# Patient Record
Sex: Female | Born: 1935 | Race: White | Hispanic: No
Health system: Southern US, Community
[De-identification: ages and names within clinical notes are randomized; demographics above are authoritative.]

---

## 1999-02-10 ENCOUNTER — Ambulatory Visit (HOSPITAL_COMMUNITY): Admission: RE | Admit: 1999-02-10 | Discharge: 1999-02-10 | Payer: Self-pay | Admitting: Family Medicine

## 1999-05-28 ENCOUNTER — Inpatient Hospital Stay (HOSPITAL_COMMUNITY): Admission: EM | Admit: 1999-05-28 | Discharge: 1999-06-02 | Payer: Self-pay | Admitting: *Deleted

## 1999-05-28 ENCOUNTER — Encounter: Payer: Self-pay | Admitting: Internal Medicine

## 1999-05-28 ENCOUNTER — Encounter (INDEPENDENT_AMBULATORY_CARE_PROVIDER_SITE_OTHER): Payer: Self-pay | Admitting: Specialist

## 1999-11-25 ENCOUNTER — Emergency Department (HOSPITAL_COMMUNITY): Admission: EM | Admit: 1999-11-25 | Discharge: 1999-11-25 | Payer: Self-pay | Admitting: Emergency Medicine

## 1999-11-25 ENCOUNTER — Encounter: Payer: Self-pay | Admitting: Emergency Medicine

## 1999-11-29 ENCOUNTER — Encounter: Payer: Self-pay | Admitting: Specialist

## 1999-11-29 ENCOUNTER — Encounter: Admission: RE | Admit: 1999-11-29 | Discharge: 1999-11-29 | Payer: Self-pay | Admitting: Specialist

## 2000-08-22 ENCOUNTER — Emergency Department (HOSPITAL_COMMUNITY): Admission: EM | Admit: 2000-08-22 | Discharge: 2000-08-22 | Payer: Self-pay | Admitting: Emergency Medicine

## 2001-02-10 ENCOUNTER — Emergency Department (HOSPITAL_COMMUNITY): Admission: EM | Admit: 2001-02-10 | Discharge: 2001-02-10 | Payer: Self-pay | Admitting: Emergency Medicine

## 2002-01-22 ENCOUNTER — Encounter: Payer: Self-pay | Admitting: Emergency Medicine

## 2002-01-23 ENCOUNTER — Observation Stay (HOSPITAL_COMMUNITY): Admission: EM | Admit: 2002-01-23 | Discharge: 2002-01-23 | Payer: Self-pay | Admitting: Emergency Medicine

## 2002-01-23 ENCOUNTER — Encounter: Payer: Self-pay | Admitting: Internal Medicine

## 2002-03-08 ENCOUNTER — Encounter: Payer: Self-pay | Admitting: Gastroenterology

## 2002-03-08 ENCOUNTER — Encounter: Admission: RE | Admit: 2002-03-08 | Discharge: 2002-03-08 | Payer: Self-pay | Admitting: Gastroenterology

## 2002-03-29 ENCOUNTER — Encounter: Payer: Self-pay | Admitting: Gastroenterology

## 2002-03-29 ENCOUNTER — Encounter: Admission: RE | Admit: 2002-03-29 | Discharge: 2002-03-29 | Payer: Self-pay | Admitting: Gastroenterology

## 2002-05-31 ENCOUNTER — Encounter: Payer: Self-pay | Admitting: Emergency Medicine

## 2002-05-31 ENCOUNTER — Inpatient Hospital Stay (HOSPITAL_COMMUNITY): Admission: EM | Admit: 2002-05-31 | Discharge: 2002-06-02 | Payer: Self-pay | Admitting: Emergency Medicine

## 2002-07-09 ENCOUNTER — Encounter: Payer: Self-pay | Admitting: Emergency Medicine

## 2002-07-09 ENCOUNTER — Inpatient Hospital Stay (HOSPITAL_COMMUNITY): Admission: EM | Admit: 2002-07-09 | Discharge: 2002-07-10 | Payer: Self-pay | Admitting: Emergency Medicine

## 2002-07-10 ENCOUNTER — Encounter: Payer: Self-pay | Admitting: Neurology

## 2002-07-11 ENCOUNTER — Emergency Department (HOSPITAL_COMMUNITY): Admission: EM | Admit: 2002-07-11 | Discharge: 2002-07-11 | Payer: Self-pay | Admitting: Emergency Medicine

## 2002-07-11 ENCOUNTER — Encounter: Payer: Self-pay | Admitting: Emergency Medicine

## 2002-10-06 ENCOUNTER — Encounter: Payer: Self-pay | Admitting: *Deleted

## 2002-10-06 ENCOUNTER — Emergency Department (HOSPITAL_COMMUNITY): Admission: EM | Admit: 2002-10-06 | Discharge: 2002-10-06 | Payer: Self-pay | Admitting: Emergency Medicine

## 2002-11-26 ENCOUNTER — Ambulatory Visit (HOSPITAL_COMMUNITY): Admission: RE | Admit: 2002-11-26 | Discharge: 2002-11-26 | Payer: Self-pay | Admitting: Neurology

## 2003-01-03 ENCOUNTER — Encounter: Payer: Self-pay | Admitting: Specialist

## 2003-01-03 ENCOUNTER — Encounter: Admission: RE | Admit: 2003-01-03 | Discharge: 2003-01-03 | Payer: Self-pay | Admitting: Specialist

## 2003-01-05 ENCOUNTER — Emergency Department (HOSPITAL_COMMUNITY): Admission: AD | Admit: 2003-01-05 | Discharge: 2003-01-05 | Payer: Self-pay | Admitting: Emergency Medicine

## 2003-01-05 ENCOUNTER — Encounter: Payer: Self-pay | Admitting: Emergency Medicine

## 2003-01-30 ENCOUNTER — Ambulatory Visit (HOSPITAL_COMMUNITY): Admission: RE | Admit: 2003-01-30 | Discharge: 2003-01-30 | Payer: Self-pay | Admitting: Family Medicine

## 2003-01-30 ENCOUNTER — Encounter: Payer: Self-pay | Admitting: Family Medicine

## 2003-02-14 ENCOUNTER — Encounter: Payer: Self-pay | Admitting: Orthopaedic Surgery

## 2003-02-19 ENCOUNTER — Encounter: Payer: Self-pay | Admitting: Orthopaedic Surgery

## 2003-02-19 ENCOUNTER — Inpatient Hospital Stay (HOSPITAL_COMMUNITY): Admission: RE | Admit: 2003-02-19 | Discharge: 2003-02-25 | Payer: Self-pay | Admitting: Orthopaedic Surgery

## 2003-02-24 ENCOUNTER — Encounter: Payer: Self-pay | Admitting: Orthopaedic Surgery

## 2003-02-25 ENCOUNTER — Encounter: Payer: Self-pay | Admitting: Orthopaedic Surgery

## 2003-03-10 ENCOUNTER — Encounter: Admission: RE | Admit: 2003-03-10 | Discharge: 2003-03-10 | Payer: Self-pay | Admitting: *Deleted

## 2003-03-12 ENCOUNTER — Encounter: Admission: RE | Admit: 2003-03-12 | Discharge: 2003-03-12 | Payer: Self-pay | Admitting: *Deleted

## 2003-06-25 ENCOUNTER — Encounter: Admission: RE | Admit: 2003-06-25 | Discharge: 2003-06-25 | Payer: Self-pay | Admitting: Family Medicine

## 2003-08-24 ENCOUNTER — Emergency Department (HOSPITAL_COMMUNITY): Admission: EM | Admit: 2003-08-24 | Discharge: 2003-08-24 | Payer: Self-pay | Admitting: Emergency Medicine

## 2003-08-30 ENCOUNTER — Emergency Department (HOSPITAL_COMMUNITY): Admission: EM | Admit: 2003-08-30 | Discharge: 2003-08-30 | Payer: Self-pay | Admitting: Family Medicine

## 2003-10-14 ENCOUNTER — Emergency Department (HOSPITAL_COMMUNITY): Admission: EM | Admit: 2003-10-14 | Discharge: 2003-10-14 | Payer: Self-pay | Admitting: Emergency Medicine

## 2004-01-23 ENCOUNTER — Emergency Department (HOSPITAL_COMMUNITY): Admission: EM | Admit: 2004-01-23 | Discharge: 2004-01-23 | Payer: Self-pay | Admitting: Emergency Medicine

## 2004-09-02 ENCOUNTER — Encounter: Admission: RE | Admit: 2004-09-02 | Discharge: 2004-09-02 | Payer: Self-pay | Admitting: Orthopaedic Surgery

## 2005-01-18 ENCOUNTER — Encounter: Admission: RE | Admit: 2005-01-18 | Discharge: 2005-01-18 | Payer: Self-pay | Admitting: Family Medicine

## 2005-02-20 ENCOUNTER — Emergency Department (HOSPITAL_COMMUNITY): Admission: EM | Admit: 2005-02-20 | Discharge: 2005-02-20 | Payer: Self-pay | Admitting: Family Medicine

## 2005-12-21 ENCOUNTER — Emergency Department (HOSPITAL_COMMUNITY): Admission: EM | Admit: 2005-12-21 | Discharge: 2005-12-21 | Payer: Self-pay | Admitting: Family Medicine

## 2006-01-09 ENCOUNTER — Emergency Department (HOSPITAL_COMMUNITY): Admission: EM | Admit: 2006-01-09 | Discharge: 2006-01-09 | Payer: Self-pay | Admitting: Emergency Medicine

## 2006-01-23 ENCOUNTER — Emergency Department (HOSPITAL_COMMUNITY): Admission: EM | Admit: 2006-01-23 | Discharge: 2006-01-23 | Payer: Self-pay | Admitting: Emergency Medicine

## 2006-01-31 ENCOUNTER — Ambulatory Visit (HOSPITAL_COMMUNITY): Admission: AD | Admit: 2006-01-31 | Discharge: 2006-02-01 | Payer: Self-pay | Admitting: Vascular Surgery

## 2006-02-02 ENCOUNTER — Emergency Department (HOSPITAL_COMMUNITY): Admission: EM | Admit: 2006-02-02 | Discharge: 2006-02-02 | Payer: Self-pay | Admitting: Emergency Medicine

## 2007-03-07 ENCOUNTER — Emergency Department (HOSPITAL_COMMUNITY): Admission: EM | Admit: 2007-03-07 | Discharge: 2007-03-07 | Payer: Self-pay | Admitting: Family Medicine

## 2007-04-03 ENCOUNTER — Emergency Department (HOSPITAL_COMMUNITY): Admission: EM | Admit: 2007-04-03 | Discharge: 2007-04-03 | Payer: Self-pay | Admitting: Emergency Medicine

## 2007-04-10 ENCOUNTER — Emergency Department (HOSPITAL_COMMUNITY): Admission: EM | Admit: 2007-04-10 | Discharge: 2007-04-10 | Payer: Self-pay | Admitting: Emergency Medicine

## 2007-04-24 ENCOUNTER — Ambulatory Visit: Payer: Self-pay | Admitting: Vascular Surgery

## 2007-09-13 ENCOUNTER — Emergency Department (HOSPITAL_COMMUNITY): Admission: EM | Admit: 2007-09-13 | Discharge: 2007-09-13 | Payer: Self-pay | Admitting: Emergency Medicine

## 2007-10-30 ENCOUNTER — Ambulatory Visit: Payer: Self-pay | Admitting: Vascular Surgery

## 2007-10-31 ENCOUNTER — Ambulatory Visit: Payer: Self-pay | Admitting: Vascular Surgery

## 2007-11-15 ENCOUNTER — Inpatient Hospital Stay (HOSPITAL_COMMUNITY): Admission: RE | Admit: 2007-11-15 | Discharge: 2007-11-16 | Payer: Self-pay | Admitting: Vascular Surgery

## 2007-11-15 ENCOUNTER — Ambulatory Visit: Payer: Self-pay | Admitting: Vascular Surgery

## 2007-11-15 ENCOUNTER — Encounter: Payer: Self-pay | Admitting: Vascular Surgery

## 2007-11-20 ENCOUNTER — Ambulatory Visit: Payer: Self-pay | Admitting: Vascular Surgery

## 2007-12-05 ENCOUNTER — Ambulatory Visit: Payer: Self-pay | Admitting: Vascular Surgery

## 2008-07-23 ENCOUNTER — Emergency Department (HOSPITAL_COMMUNITY): Admission: EM | Admit: 2008-07-23 | Discharge: 2008-07-23 | Payer: Self-pay | Admitting: Emergency Medicine

## 2008-11-20 ENCOUNTER — Emergency Department (HOSPITAL_COMMUNITY): Admission: EM | Admit: 2008-11-20 | Discharge: 2008-11-20 | Payer: Self-pay | Admitting: Emergency Medicine

## 2009-01-14 ENCOUNTER — Ambulatory Visit: Payer: Self-pay | Admitting: Pulmonary Disease

## 2009-01-14 ENCOUNTER — Inpatient Hospital Stay (HOSPITAL_COMMUNITY): Admission: EM | Admit: 2009-01-14 | Discharge: 2009-02-12 | Payer: Self-pay | Admitting: Emergency Medicine

## 2009-01-15 ENCOUNTER — Encounter (INDEPENDENT_AMBULATORY_CARE_PROVIDER_SITE_OTHER): Payer: Self-pay | Admitting: Cardiovascular Disease

## 2009-01-15 ENCOUNTER — Ambulatory Visit: Payer: Self-pay | Admitting: Cardiothoracic Surgery

## 2009-01-19 ENCOUNTER — Encounter: Payer: Self-pay | Admitting: Internal Medicine

## 2009-01-22 ENCOUNTER — Encounter (INDEPENDENT_AMBULATORY_CARE_PROVIDER_SITE_OTHER): Payer: Self-pay | Admitting: Interventional Radiology

## 2009-02-06 ENCOUNTER — Encounter (INDEPENDENT_AMBULATORY_CARE_PROVIDER_SITE_OTHER): Payer: Self-pay | Admitting: Gastroenterology

## 2009-02-16 ENCOUNTER — Emergency Department (HOSPITAL_COMMUNITY): Admission: EM | Admit: 2009-02-16 | Discharge: 2009-02-16 | Payer: Self-pay | Admitting: Emergency Medicine

## 2009-02-24 ENCOUNTER — Telehealth: Payer: Self-pay | Admitting: Internal Medicine

## 2009-02-24 ENCOUNTER — Ambulatory Visit (HOSPITAL_COMMUNITY): Admission: RE | Admit: 2009-02-24 | Discharge: 2009-02-24 | Payer: Self-pay | Admitting: Internal Medicine

## 2009-03-05 ENCOUNTER — Ambulatory Visit: Payer: Self-pay | Admitting: Vascular Surgery

## 2009-08-06 ENCOUNTER — Inpatient Hospital Stay (HOSPITAL_COMMUNITY): Admission: EM | Admit: 2009-08-06 | Discharge: 2009-08-12 | Payer: Self-pay | Admitting: Emergency Medicine

## 2009-08-06 ENCOUNTER — Ambulatory Visit: Payer: Self-pay | Admitting: Internal Medicine

## 2009-08-06 ENCOUNTER — Ambulatory Visit: Payer: Self-pay | Admitting: Pulmonary Disease

## 2009-08-06 ENCOUNTER — Encounter: Payer: Self-pay | Admitting: Internal Medicine

## 2009-08-07 ENCOUNTER — Encounter: Payer: Self-pay | Admitting: Internal Medicine

## 2009-08-08 ENCOUNTER — Ambulatory Visit: Payer: Self-pay | Admitting: Vascular Surgery

## 2009-08-11 ENCOUNTER — Ambulatory Visit: Payer: Self-pay | Admitting: Physical Medicine & Rehabilitation

## 2009-08-12 ENCOUNTER — Inpatient Hospital Stay (HOSPITAL_COMMUNITY)
Admission: RE | Admit: 2009-08-12 | Discharge: 2009-08-28 | Payer: Self-pay | Admitting: Physical Medicine & Rehabilitation

## 2009-08-14 ENCOUNTER — Encounter: Payer: Self-pay | Admitting: Internal Medicine

## 2009-08-17 ENCOUNTER — Encounter: Payer: Self-pay | Admitting: Physical Medicine & Rehabilitation

## 2009-08-20 ENCOUNTER — Ambulatory Visit: Payer: Self-pay | Admitting: Oncology

## 2009-08-22 ENCOUNTER — Ambulatory Visit: Payer: Self-pay | Admitting: Physical Medicine & Rehabilitation

## 2009-08-28 ENCOUNTER — Ambulatory Visit: Payer: Self-pay | Admitting: Oncology

## 2009-08-28 ENCOUNTER — Inpatient Hospital Stay (HOSPITAL_COMMUNITY): Admission: EM | Admit: 2009-08-28 | Discharge: 2009-09-08 | Payer: Self-pay | Admitting: Internal Medicine

## 2009-08-29 ENCOUNTER — Ambulatory Visit: Payer: Self-pay | Admitting: Oncology

## 2009-08-30 ENCOUNTER — Ambulatory Visit: Payer: Self-pay | Admitting: Oncology

## 2009-09-01 ENCOUNTER — Other Ambulatory Visit: Payer: Self-pay | Admitting: Oncology

## 2009-09-03 ENCOUNTER — Other Ambulatory Visit: Payer: Self-pay | Admitting: Oncology

## 2009-09-05 ENCOUNTER — Ambulatory Visit: Payer: Self-pay | Admitting: Oncology

## 2009-09-09 ENCOUNTER — Ambulatory Visit: Admission: RE | Admit: 2009-09-09 | Discharge: 2009-09-18 | Payer: Self-pay | Admitting: Radiation Oncology

## 2009-09-11 ENCOUNTER — Encounter: Admission: RE | Admit: 2009-09-11 | Discharge: 2009-12-10 | Payer: Self-pay | Admitting: Oncology

## 2009-09-18 LAB — BASIC METABOLIC PANEL
BUN: 29 mg/dL — ABNORMAL HIGH (ref 6–23)
Chloride: 104 mEq/L (ref 96–112)
Creatinine, Ser: 0.86 mg/dL (ref 0.40–1.20)
Glucose, Bld: 108 mg/dL — ABNORMAL HIGH (ref 70–99)
Potassium: 4.5 mEq/L (ref 3.5–5.3)

## 2009-09-18 LAB — PHENYTOIN LEVEL, TOTAL: Phenytoin Lvl: 2.7 ug/mL — ABNORMAL LOW (ref 10.0–20.0)

## 2009-09-22 ENCOUNTER — Inpatient Hospital Stay (HOSPITAL_COMMUNITY): Admission: EM | Admit: 2009-09-22 | Discharge: 2009-09-26 | Payer: Self-pay | Admitting: Emergency Medicine

## 2009-09-28 ENCOUNTER — Inpatient Hospital Stay (HOSPITAL_COMMUNITY): Admission: EM | Admit: 2009-09-28 | Discharge: 2009-09-30 | Payer: Self-pay | Admitting: Neurology

## 2009-09-29 ENCOUNTER — Ambulatory Visit: Payer: Self-pay | Admitting: Oncology

## 2009-10-05 ENCOUNTER — Ambulatory Visit: Admission: RE | Admit: 2009-10-05 | Discharge: 2009-10-05 | Payer: Self-pay | Admitting: Radiation Oncology

## 2009-10-05 ENCOUNTER — Inpatient Hospital Stay (HOSPITAL_COMMUNITY)
Admission: AD | Admit: 2009-10-05 | Discharge: 2009-10-13 | Disposition: A | Discharge status: Hospice | Payer: Self-pay | Source: Home / Self Care | Admitting: Oncology

## 2009-10-05 ENCOUNTER — Ambulatory Visit: Payer: Self-pay | Admitting: Oncology

## 2009-11-08 DEATH — deceased

## 2010-02-15 IMAGING — CR DG CHEST 2V
2 series · 2 of 2 positions shown · non-contrast
Comparison: 01/30/2009

CLINICAL DATA: Chest pain.  Myocardial infarction.

CHEST - 2 VIEW

[w chest pa]
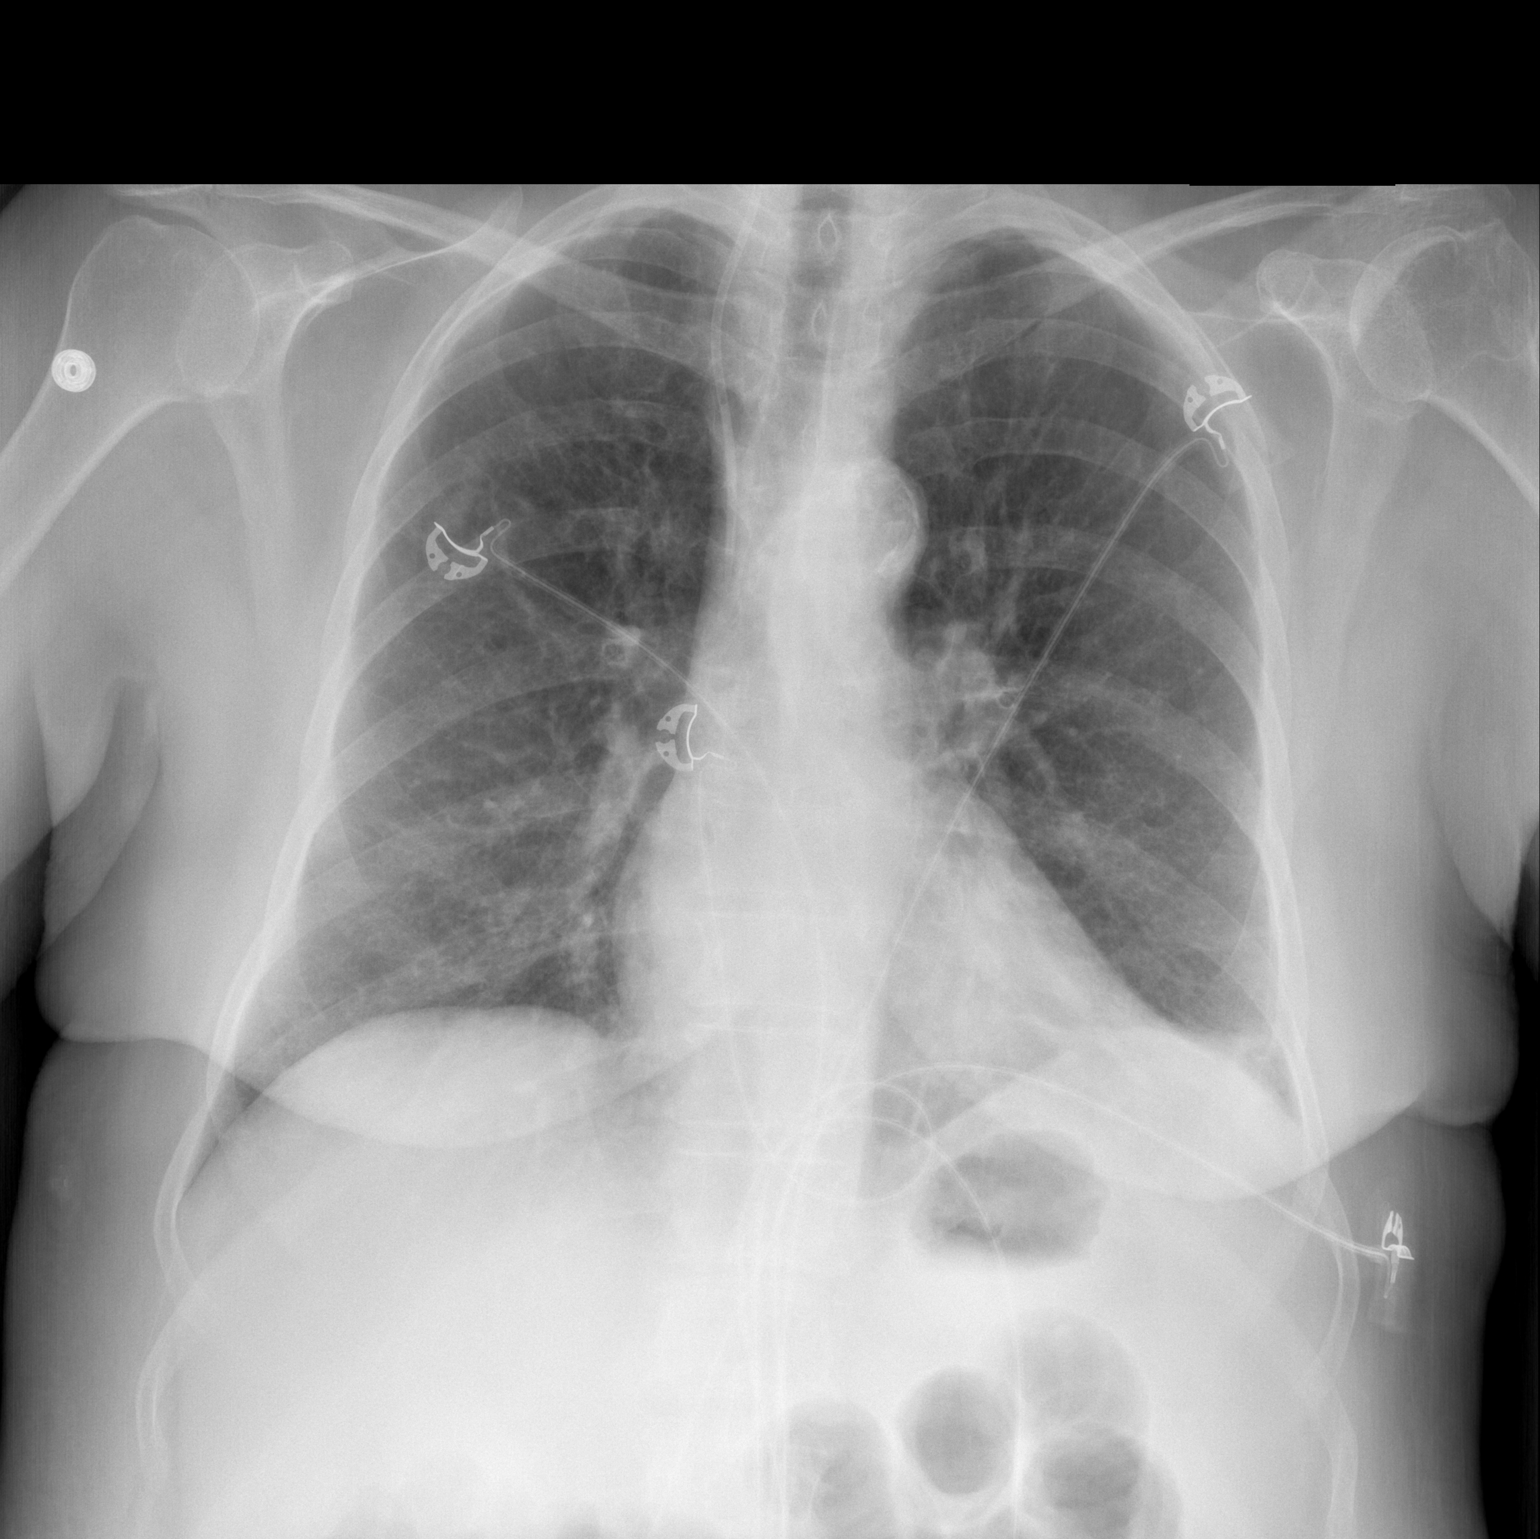

[w chest lat]
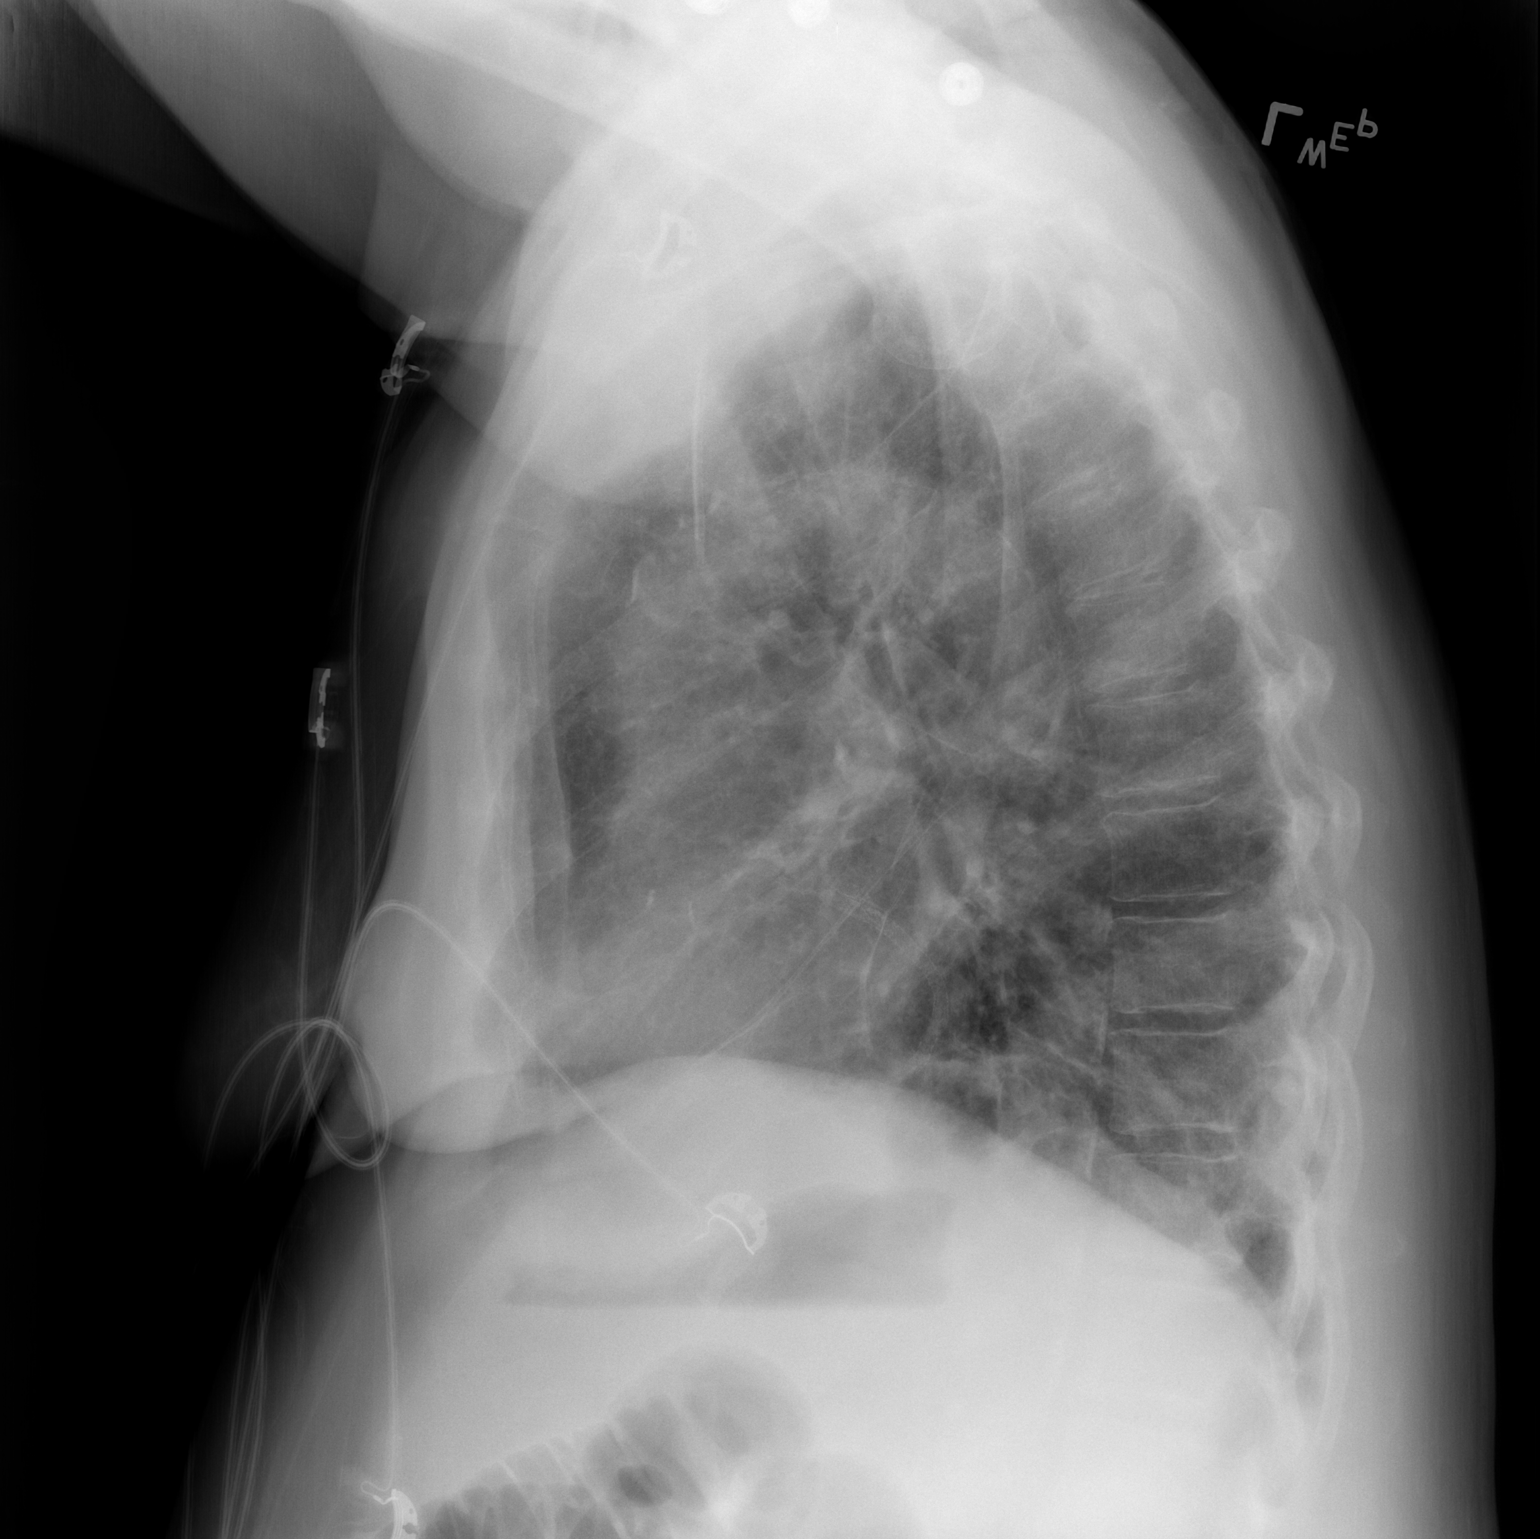

[2 of 2 positions shown; findings below may reference images not displayed]

FINDINGS: There is a right IJ catheter with tip in the SVC.

The heart size is normal.

Mild interstitial edema is noted.

There has been improved aeration to both lung bases.
IMPRESSION: 1.  Mild residual interstitial edema.
2.  Improving basilar aeration.

## 2010-08-01 ENCOUNTER — Encounter: Payer: Self-pay | Admitting: Neurology

## 2010-08-12 NOTE — Miscellaneous (Signed)
Summary: hospital admission  INTERNAL MEDICINE ADMISSION HISTORY AND PHYSICAL  PCP: Dr. Toni Arthurs [phone]. Fax: [phone])  Cardiologist: [facility]   ATTENDING 1st contact: Dr. Scot Dock: [phone] 2nd contact : Dr. Sherryll Burger: [phone] Nights and weekends: 1st: [phone]. 2nd: [phone]  CC:  HPI: 77 y/o woman with PMH of CAD s/p stenting in July 2010, DVP with carotid endarterectomy, cerebral aneurysm repaired in 2003 who comes to ED with cc of rt sided weakness since last 5 days. The weakness started all of a sudden. Patient noticed this first time as she got out of the bed in the morning. She felt weakness just on the right arm and leg. No change on the left side, no speech/swallowing difficulty, no vision change, no headaches, chest pain or other precipitating event before the onset of events. The weakness had been waxing and waning butsince yesterday it has been worse. She also has some numbness in her right leg . She fell down due to weakness on DOA and hit her head and chest.   Patient is not able to bear her weight on the rt side now. She saw her PCP 1 day after onset of symptoms, no worker for stroke was done at that time.    The family is at the bedside, who reports that they have not noticed any change in the patient's speech or other motro sensory function except right sided weakness.   She also complaints of some left sided chest pain that started after the fall. Intermittent. radiating to arm. Worse by movement. Not releived by anything.  ALLERGIES: Cipro, Supha, Rocephin, Lipitor - rash  PAST MEDICAL HISTORY:     MEDICATIONS: . Aspirin 325 mg one daily. . Nitroglycerin 1/150 under tongue as needed for chest pain, one     every 5 minutes up to three over 15 minutes. . Protonix 40 mg one twice a day. . Cozaar 50 mg one daily. Marland Kitchen Plavix 75 mg daily. . Tylenol/ibuprofen 650 mg two every 4 hours as needed for discomfort. . Ensure chocolate twice a day. Marland Kitchen Paxil: 10mg  by mouth  daily  SOCIAL HISTORY: - smoking: quit 2 days ago. has been smoking 1pack/day before that for >40 years.  - no alcohol/drugs - lives with daughter and her family. Has somebody in the family who takes care of her 24 hrs/day   FAMILY HISTORY significant for stroke, HTN, CAD in her mother.   VITALS: T: 97.7   P: 78  BP:187/86  R:18  O2SAT:  93 %    ON: RA  PHYSICAL EXAM: Gen: awake, lying in bed, NAD HEENT: PERRLA/EOMI, no pallor, jaundice CVS:  s1 normal,  systolic murmur on left sternal border Respi: coarse breath sounds b/l with occasional wheezes Abd: s/NT/ND, healed incision from previous abdminal surgery.  Neuro: Cranial: 2-12 intact Sensation: decreased in the rt lef and rt foot. normal elsewhere Motor: 3-4/5 strength on right arm and leg. Normal strength on the left side Reflexes: up going babinski refelxes in both sides, Balance: no cerebellar signs. normal coordination,  Gait: no able to asess as patient is unable to bear weight on thert side Back: normal, healed surgical scar.   Extremity: pulsations b/l, no edema  LABS:   WBC                                      7.3  4.0-10.5         K/uL  RBC                                      4.24              3.87-5.11        MIL/uL  Hemoglobin (HGB)                         13.5              12.0-15.0        g/dL  Hematocrit (HCT)                         38.6              36.0-46.0        %  MCV                                      91.0              78.0-100.0       fL  MCHC                                     35.0              30.0-36.0        g/dL  RDW                                      12.8              11.5-15.5        %  Platelet Count (PLT)                     246               150-400          K/uL  Neutrophils, %                           75                43-77            %  Lymphocytes, %                           18                12-46            %  Monocytes, %                             4                  3-12             %  Eosinophils, %  2                 0-5              %  Basophils, %                             1                 0-1              %  Neutrophils, Absolute                    5.4               1.7-7.7          K/uL  Lymphocytes, Absolute                    1.3               0.7-4.0          K/uL  Monocytes, Absolute                      0.3               0.1-1.0          K/uL  Eosinophils, Absolute                    0.2               0.0-0.7          K/uL  Basophils, Absolute                      0.1               0.0-0.1          K/uL   Sodium (NA)                              135               135-145          mEq/L  Potassium (K)                            3.6               3.5-5.1          mEq/L  Chloride                                 102               96-112           mEq/L  CO2                                      24                19-32            mEq/L  Glucose  100        h      70-99            mg/dL  BUN                                      18                6-23             mg/dL  Creatinine                               0.99              0.4-1.2          mg/dL  Calcium                                  8.9               8.4-10.5         mg/dL  CE:  Troponin I                               0.01              0.00-0.06        ng/mL Creatine Kinase, Total                   110               7-177            U/L CK, MB                                   6.0        h      0.3-4.0          ng/mL Relative Index                           5.5        h      0.0-2.5  CT head:  1.  Stable postoperative changes as above. 2.  Acute non-hemorrhagic infarct in the left hemisphere as above.  EKG ASSESSMENT AND PLAN:  1. Ischemic stroke:  - She is having a stroke and is clearly outside the therapeutic window.  - She is not a candidate for TPA as she has a h/o brain aneurysm - She has been on aspirin and  plavix at home- so she has failed this treatment  Plan: - Risk stratification- will get lipid profile, HbA1C, TSH - Sourse: will get 2D echo and carotid doppler. Possible- hypercoaguable state from malignancy.  - aspirin+dipyridimalo( aggrenox) for aniplatelet activity but not clear benifit  - she discontinued taking statin ( crestor) - she says due to financial reasons.  -PT/OT-inpatient rehab if patient agrees post discharge  2. CAD:  - will continue with her home medications  - no futher work up at this time as no complaints suggestive of ACS - EKG no acute change  3. Lung mass - needs a tissue  biopsy at some point as outpatient. An appointment was scheduled with Dr. Marchelle Gearing but patient never made it to the appointment. will get a CT scan to see if the mass has chenged in any way and reschedule an appointment with Dr. Marchelle Gearing  4. Depression- continue with paxil.   5. VTE PROPH: SCDs for now. my need anticoagulation as she may be hypercoagulable due to lung malignancy. But the risk of bleeding from her brain anurysm is high.

## 2010-08-12 NOTE — Miscellaneous (Signed)
Summary: pt no show  ---- Converted from flag ---- ---- 08/08/2009 2:23 PM, Kalman Shan MD wrote: Candise Bowens, Can you investigate what happend to patient and why she no showed. And when she no showed if letter was sent or not. I met with resident Jarvis Newcomer an hour ago. Patient now inpatient and mass is now 4.5cm and much larger. Apparently she told the resident no one told her about the mass which I know is not correct. Thanks, Shonette Rhames ------------------------------  We do not routinely send out letters for first no show. I received a flag and scheduled the pt for the appt. She received a reminder call 2 days before appt.

## 2010-09-06 IMAGING — CT CT HEAD W/ CM
1 of 2 series · 13 of 30 positions shown, 17 images · IV contrast (agent unspecified)
Comparison: 08/28/2009 and earlier.

CLINICAL DATA: 78-year-old female with lung cancer and cerebral
edema, suspicion of metastatic disease.

CT HEAD WITH CONTRAST
TECHNIQUE: Contiguous axial images were obtained from the base of
the skull through the vertex with intravenous contrast
Contrast: 100 ml [id number].

[Series 2: brain · axial · 0.55mm/px · z∈[+142,+274]mm · 13 of 28 slices shown, 17 images]
[im 2/28  brain]
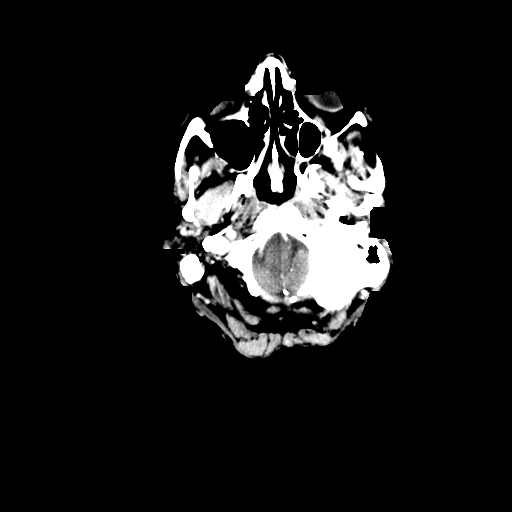
[im 2/28  bone]
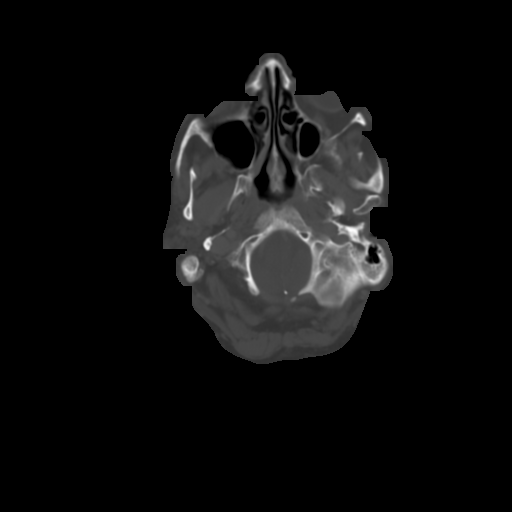
[im 4/28  brain]
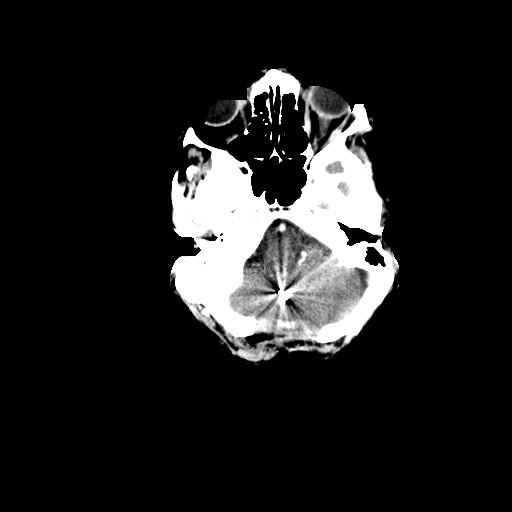
[im 6/28  brain]
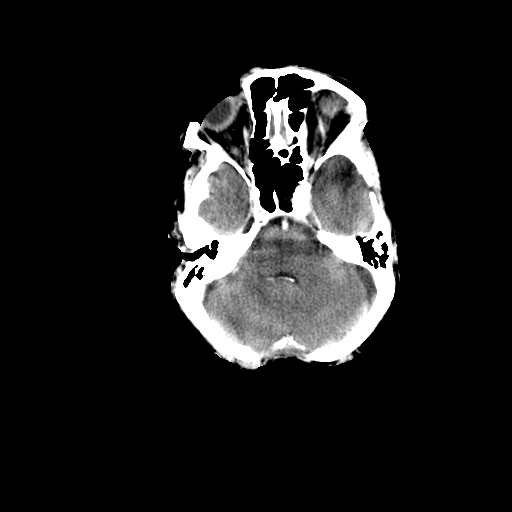
[im 8/28  brain]
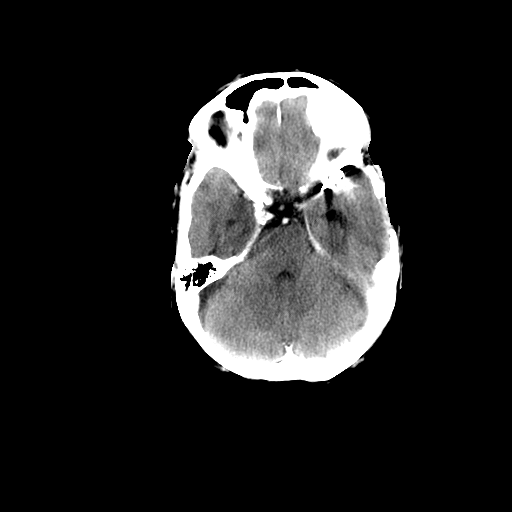
[im 10/28  brain]
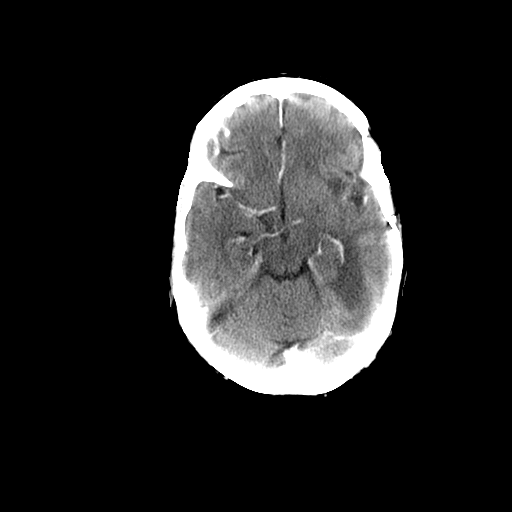
[im 10/28  bone]
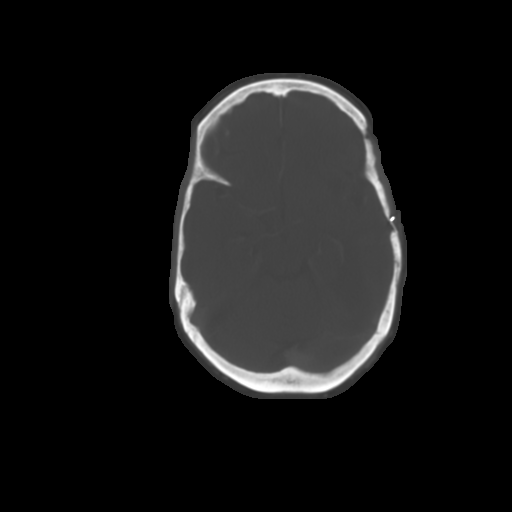
[im 12/28  brain]
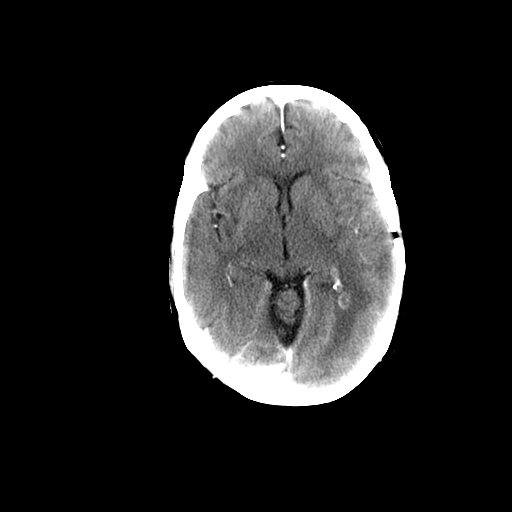
[im 14/28  brain]
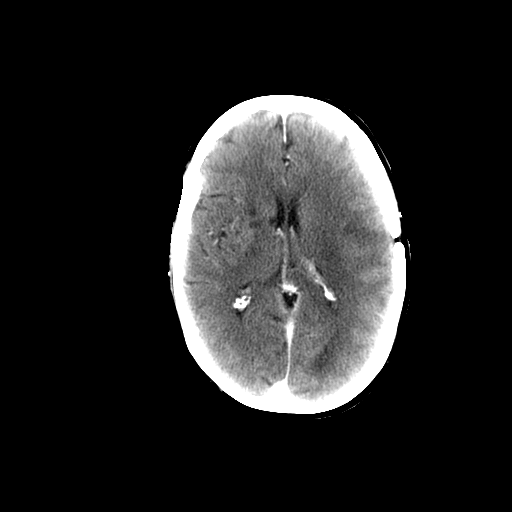
[im 16/28  brain]
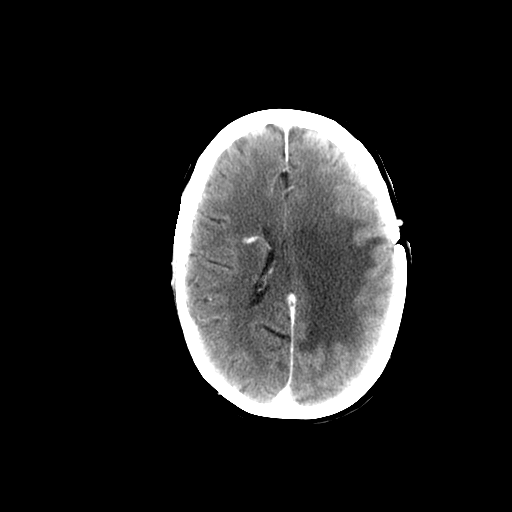
[im 18/28  brain]
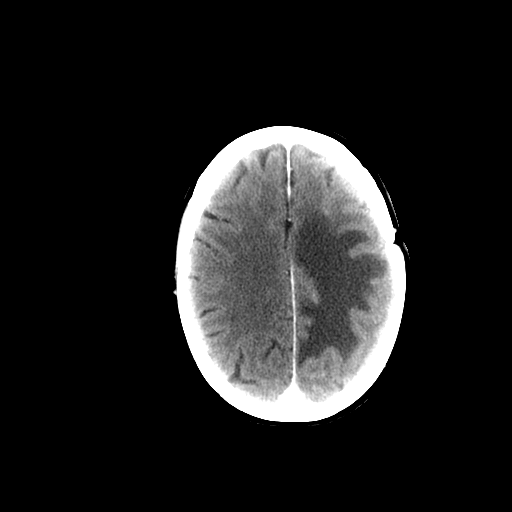
[im 18/28  bone]
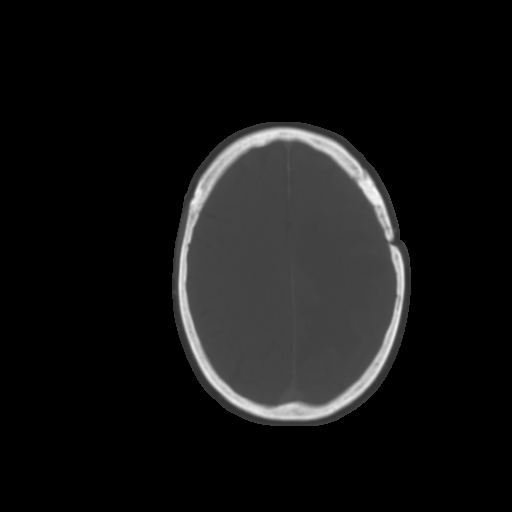
[im 20/28  brain]
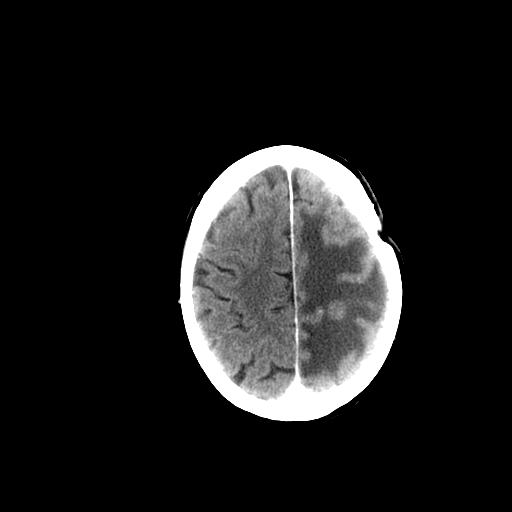
[im 22/28  brain]
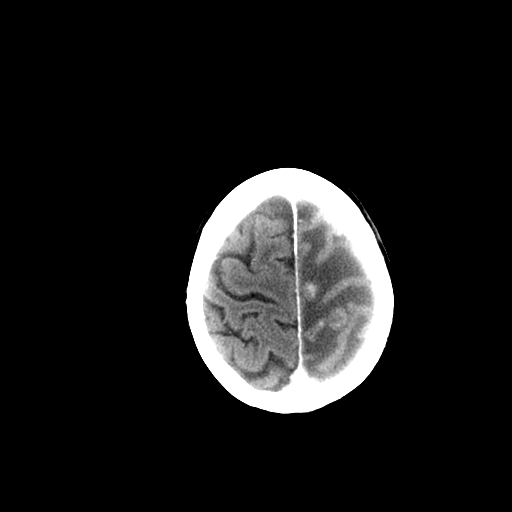
[im 24/28  brain]
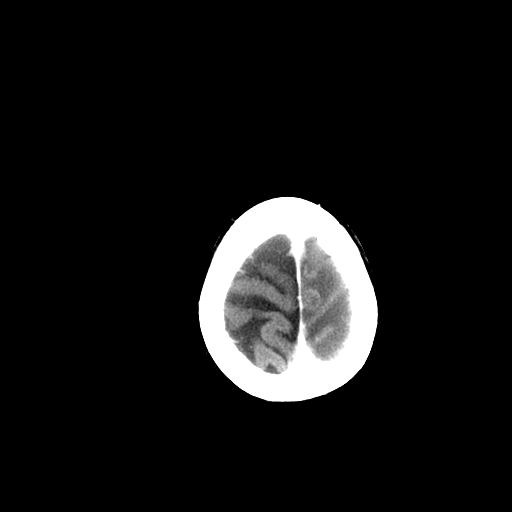
[im 26/28  brain]
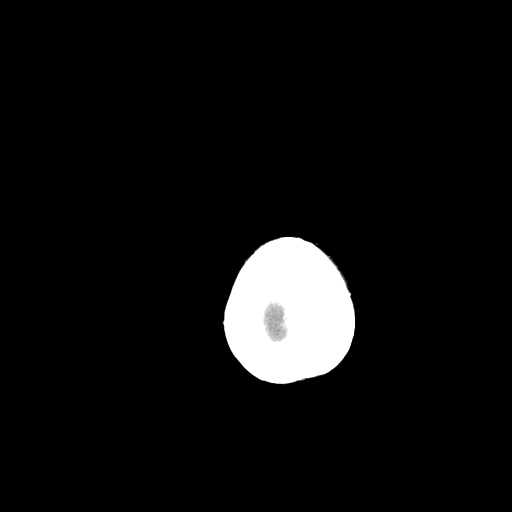
[im 26/28  bone]
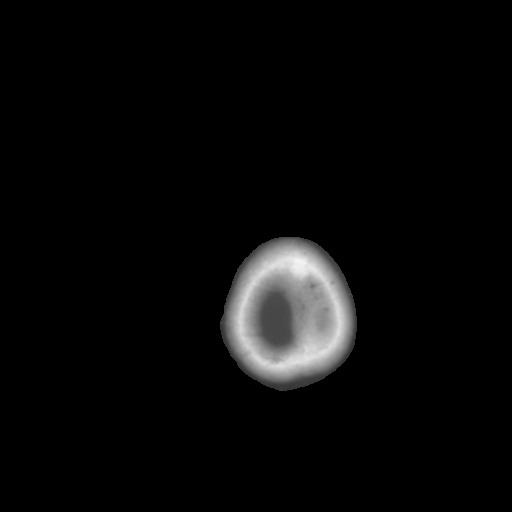

[13 of 30 positions shown; findings below may reference images not displayed]

FINDINGS: Sequelae of left MCA region aneurysm clipping again noted
with left frontotemporal craniotomy changes, and left suboccipital
craniectomy with surgical clips along the inferior cerebellum.
Calcified atherosclerosis at the skull base.  Visualized paranasal
sinuses and mastoids are clear.  No suspicious osseous lesion.
Stable orbits and scalp.

There are at least three enhancing masses in the left hemisphere
amidst the areas of vasogenic edema.  These measure up to 13 mm in
diameter (series 2 image 23, image 21, image 12).

The hyperdense lesion in the right periventricular white matter
appears to be an area of developmental venous anomaly.

Chronic lacunar type infarcts in the right basal ganglia, and the
cerebellum again noted.  Stable postoperative encephalomalacia at
the left anterior insula and temporal lobe are stable.
IMPRESSION: 1.  Three metastases identified amidst the area of vasogenic edema
in the left hemisphere.  Largest 13 mm in diameter.
2.  Hyperdense focus in the right periventricular white matter most
compatible with developmental venous anomaly.  There could be a
small associated cavernous malformation accounting for the
intrinsic density.
3.  Other chronic findings as above.

## 2010-09-23 ENCOUNTER — Ambulatory Visit: Payer: Self-pay | Admitting: Vascular Surgery

## 2010-09-23 ENCOUNTER — Other Ambulatory Visit: Payer: Self-pay

## 2010-09-26 LAB — DIFFERENTIAL
Basophils Absolute: 0.1 10*3/uL (ref 0.0–0.1)
Basophils Relative: 1 % (ref 0–1)
Eosinophils Absolute: 0.2 10*3/uL (ref 0.0–0.7)
Eosinophils Relative: 2 % (ref 0–5)
Neutrophils Relative %: 75 % (ref 43–77)

## 2010-09-26 LAB — CARDIAC PANEL(CRET KIN+CKTOT+MB+TROPI)
CK, MB: 2.9 ng/mL (ref 0.3–4.0)
Relative Index: INVALID (ref 0.0–2.5)
Relative Index: INVALID (ref 0.0–2.5)
Relative Index: INVALID (ref 0.0–2.5)
Relative Index: INVALID (ref 0.0–2.5)
Total CK: 33 U/L (ref 7–177)
Total CK: 70 U/L (ref 7–177)
Troponin I: 0.01 ng/mL (ref 0.00–0.06)
Troponin I: 0.02 ng/mL (ref 0.00–0.06)
Troponin I: 0.03 ng/mL (ref 0.00–0.06)

## 2010-09-26 LAB — COMPREHENSIVE METABOLIC PANEL
BUN: 19 mg/dL (ref 6–23)
CO2: 25 mEq/L (ref 19–32)
Calcium: 8.9 mg/dL (ref 8.4–10.5)
Creatinine, Ser: 1.09 mg/dL (ref 0.4–1.2)
GFR calc non Af Amer: 49 mL/min — ABNORMAL LOW (ref >60)
Glucose, Bld: 100 mg/dL — ABNORMAL HIGH (ref 70–99)
Total Bilirubin: 0.8 mg/dL (ref 0.3–1.2)

## 2010-09-26 LAB — CBC
HCT: 37.5 % (ref 36.0–46.0)
HCT: 38.6 % (ref 36.0–46.0)
Hemoglobin: 13.1 g/dL (ref 12.0–15.0)
Hemoglobin: 13.1 g/dL (ref 12.0–15.0)
MCHC: 33.7 g/dL (ref 30.0–36.0)
MCHC: 34.9 g/dL (ref 30.0–36.0)
MCHC: 35 g/dL (ref 30.0–36.0)
MCV: 91 fL (ref 78.0–100.0)
MCV: 91.1 fL (ref 78.0–100.0)
MCV: 92.9 fL (ref 78.0–100.0)
Platelets: 246 10*3/uL (ref 150–400)
RBC: 4.12 MIL/uL (ref 3.87–5.11)
RDW: 12.8 % (ref 11.5–15.5)
RDW: 13.3 % (ref 11.5–15.5)

## 2010-09-26 LAB — BASIC METABOLIC PANEL
BUN: 18 mg/dL (ref 6–23)
BUN: 32 mg/dL — ABNORMAL HIGH (ref 6–23)
CO2: 23 mEq/L (ref 19–32)
CO2: 28 mEq/L (ref 19–32)
Calcium: 8.5 mg/dL (ref 8.4–10.5)
Calcium: 9.4 mg/dL (ref 8.4–10.5)
Chloride: 101 mEq/L (ref 96–112)
Chloride: 102 mEq/L (ref 96–112)
Chloride: 103 mEq/L (ref 96–112)
Creatinine, Ser: 0.99 mg/dL (ref 0.4–1.2)
Creatinine, Ser: 1.18 mg/dL (ref 0.4–1.2)
Creatinine, Ser: 1.2 mg/dL (ref 0.4–1.2)
Creatinine, Ser: 1.36 mg/dL — ABNORMAL HIGH (ref 0.4–1.2)
GFR calc Af Amer: 54 mL/min — ABNORMAL LOW (ref >60)
GFR calc non Af Amer: 45 mL/min — ABNORMAL LOW (ref >60)
Glucose, Bld: 100 mg/dL — ABNORMAL HIGH (ref 70–99)
Glucose, Bld: 102 mg/dL — ABNORMAL HIGH (ref 70–99)
Glucose, Bld: 114 mg/dL — ABNORMAL HIGH (ref 70–99)
Glucose, Bld: 93 mg/dL (ref 70–99)
Potassium: 3.6 mEq/L (ref 3.5–5.1)
Potassium: 4.4 mEq/L (ref 3.5–5.1)
Sodium: 134 mEq/L — ABNORMAL LOW (ref 135–145)

## 2010-09-26 LAB — APTT: aPTT: 29 seconds (ref 24–37)

## 2010-09-26 LAB — RAPID URINE DRUG SCREEN, HOSP PERFORMED
Amphetamines: NOT DETECTED
Barbiturates: NOT DETECTED

## 2010-09-26 LAB — TROPONIN I: Troponin I: 0.01 ng/mL (ref 0.00–0.06)

## 2010-09-26 LAB — LIPID PANEL
Cholesterol: 222 mg/dL — ABNORMAL HIGH (ref 0–200)
HDL: 37 mg/dL — ABNORMAL LOW (ref >39)
Triglycerides: 140 mg/dL (ref <150)

## 2010-09-26 LAB — CK TOTAL AND CKMB (NOT AT ARMC): Relative Index: 5.5 — ABNORMAL HIGH (ref 0.0–2.5)

## 2010-09-26 LAB — HEMOGLOBIN A1C: Hgb A1c MFr Bld: 5.8 % (ref 4.6–6.1)

## 2010-09-26 LAB — PROTIME-INR: Prothrombin Time: 13.6 seconds (ref 11.6–15.2)

## 2010-09-29 LAB — DIFFERENTIAL
Basophils Absolute: 0 10*3/uL (ref 0.0–0.1)
Basophils Absolute: 0 10*3/uL (ref 0.0–0.1)
Basophils Relative: 0 % (ref 0–1)
Basophils Relative: 1 % (ref 0–1)
Basophils Relative: 1 % (ref 0–1)
Eosinophils Absolute: 0 10*3/uL (ref 0.0–0.7)
Eosinophils Absolute: 0.2 10*3/uL (ref 0.0–0.7)
Eosinophils Absolute: 0.3 10*3/uL (ref 0.0–0.7)
Eosinophils Absolute: 0.3 10*3/uL (ref 0.0–0.7)
Eosinophils Relative: 0 % (ref 0–5)
Eosinophils Relative: 3 % (ref 0–5)
Eosinophils Relative: 3 % (ref 0–5)
Lymphocytes Relative: 10 % — ABNORMAL LOW (ref 12–46)
Lymphocytes Relative: 34 % (ref 12–46)
Lymphs Abs: 0.9 10*3/uL (ref 0.7–4.0)
Lymphs Abs: 1.7 10*3/uL (ref 0.7–4.0)
Lymphs Abs: 1.8 10*3/uL (ref 0.7–4.0)
Lymphs Abs: 3.3 10*3/uL (ref 0.7–4.0)
Monocytes Absolute: 0.2 10*3/uL (ref 0.1–1.0)
Monocytes Absolute: 0.4 10*3/uL (ref 0.1–1.0)
Monocytes Absolute: 0.4 10*3/uL (ref 0.1–1.0)
Monocytes Absolute: 0.5 10*3/uL (ref 0.1–1.0)
Monocytes Relative: 2 % — ABNORMAL LOW (ref 3–12)
Monocytes Relative: 4 % (ref 3–12)
Monocytes Relative: 5 % (ref 3–12)
Monocytes Relative: 7 % (ref 3–12)
Neutro Abs: 5.5 10*3/uL (ref 1.7–7.7)
Neutro Abs: 7.6 10*3/uL (ref 1.7–7.7)
Neutrophils Relative %: 57 % (ref 43–77)
Neutrophils Relative %: 59 % (ref 43–77)
Neutrophils Relative %: 76 % (ref 43–77)
Neutrophils Relative %: 87 % — ABNORMAL HIGH (ref 43–77)

## 2010-09-29 LAB — BASIC METABOLIC PANEL
BUN: 21 mg/dL (ref 6–23)
BUN: 24 mg/dL — ABNORMAL HIGH (ref 6–23)
BUN: 25 mg/dL — ABNORMAL HIGH (ref 6–23)
BUN: 30 mg/dL — ABNORMAL HIGH (ref 6–23)
BUN: 31 mg/dL — ABNORMAL HIGH (ref 6–23)
BUN: 38 mg/dL — ABNORMAL HIGH (ref 6–23)
BUN: 17 mg/dL (ref 6–23)
BUN: 20 mg/dL (ref 6–23)
CO2: 23 mEq/L (ref 19–32)
CO2: 24 mEq/L (ref 19–32)
CO2: 25 mEq/L (ref 19–32)
CO2: 27 mEq/L (ref 19–32)
CO2: 25 mEq/L (ref 19–32)
CO2: 26 mEq/L (ref 19–32)
Calcium: 8.2 mg/dL — ABNORMAL LOW (ref 8.4–10.5)
Calcium: 8.5 mg/dL (ref 8.4–10.5)
Calcium: 8.7 mg/dL (ref 8.4–10.5)
Calcium: 9.2 mg/dL (ref 8.4–10.5)
Calcium: 9.2 mg/dL (ref 8.4–10.5)
Calcium: 8.4 mg/dL (ref 8.4–10.5)
Calcium: 8.4 mg/dL (ref 8.4–10.5)
Chloride: 101 mEq/L (ref 96–112)
Chloride: 103 mEq/L (ref 96–112)
Chloride: 103 mEq/L (ref 96–112)
Chloride: 106 mEq/L (ref 96–112)
Chloride: 100 mEq/L (ref 96–112)
Chloride: 103 mEq/L (ref 96–112)
Creatinine, Ser: 0.87 mg/dL (ref 0.4–1.2)
Creatinine, Ser: 0.94 mg/dL (ref 0.4–1.2)
Creatinine, Ser: 1.18 mg/dL (ref 0.4–1.2)
Creatinine, Ser: 1.37 mg/dL — ABNORMAL HIGH (ref 0.4–1.2)
Creatinine, Ser: 1.45 mg/dL — ABNORMAL HIGH (ref 0.4–1.2)
Creatinine, Ser: 0.87 mg/dL (ref 0.4–1.2)
Creatinine, Ser: 0.93 mg/dL (ref 0.4–1.2)
GFR calc Af Amer: 40 mL/min — ABNORMAL LOW (ref >60)
GFR calc Af Amer: 42 mL/min — ABNORMAL LOW (ref >60)
GFR calc Af Amer: 45 mL/min — ABNORMAL LOW (ref >60)
GFR calc Af Amer: 54 mL/min — ABNORMAL LOW (ref >60)
GFR calc Af Amer: 59 mL/min — ABNORMAL LOW (ref >60)
GFR calc Af Amer: >60 mL/min (ref >60)
GFR calc Af Amer: >60 mL/min (ref >60)
GFR calc Af Amer: >60 mL/min (ref >60)
GFR calc non Af Amer: 37 mL/min — ABNORMAL LOW (ref >60)
GFR calc non Af Amer: 45 mL/min — ABNORMAL LOW (ref >60)
GFR calc non Af Amer: 45 mL/min — ABNORMAL LOW (ref >60)
GFR calc non Af Amer: 49 mL/min — ABNORMAL LOW (ref >60)
GFR calc non Af Amer: 58 mL/min — ABNORMAL LOW (ref >60)
GFR calc non Af Amer: >60 mL/min (ref >60)
GFR calc non Af Amer: 59 mL/min — ABNORMAL LOW (ref >60)
GFR calc non Af Amer: >60 mL/min (ref >60)
Glucose, Bld: 103 mg/dL — ABNORMAL HIGH (ref 70–99)
Glucose, Bld: 106 mg/dL — ABNORMAL HIGH (ref 70–99)
Glucose, Bld: 115 mg/dL — ABNORMAL HIGH (ref 70–99)
Glucose, Bld: 137 mg/dL — ABNORMAL HIGH (ref 70–99)
Glucose, Bld: 95 mg/dL (ref 70–99)
Glucose, Bld: 97 mg/dL (ref 70–99)
Glucose, Bld: 140 mg/dL — ABNORMAL HIGH (ref 70–99)
Glucose, Bld: 144 mg/dL — ABNORMAL HIGH (ref 70–99)
Potassium: 3.5 mEq/L (ref 3.5–5.1)
Potassium: 3.8 mEq/L (ref 3.5–5.1)
Potassium: 3.9 mEq/L (ref 3.5–5.1)
Potassium: 4 mEq/L (ref 3.5–5.1)
Potassium: 4 mEq/L (ref 3.5–5.1)
Potassium: 4.1 mEq/L (ref 3.5–5.1)
Potassium: 3.6 mEq/L (ref 3.5–5.1)
Potassium: 4 mEq/L (ref 3.5–5.1)
Sodium: 135 mEq/L (ref 135–145)
Sodium: 137 mEq/L (ref 135–145)
Sodium: 137 mEq/L (ref 135–145)
Sodium: 139 mEq/L (ref 135–145)
Sodium: 134 mEq/L — ABNORMAL LOW (ref 135–145)
Sodium: 136 mEq/L (ref 135–145)

## 2010-09-29 LAB — URINE CULTURE
Colony Count: 90000
Colony Count: NO GROWTH
Special Requests: POSITIVE

## 2010-09-29 LAB — CBC
HCT: 35 % — ABNORMAL LOW (ref 36.0–46.0)
HCT: 36.4 % (ref 36.0–46.0)
HCT: 37.8 % (ref 36.0–46.0)
Hemoglobin: 12.2 g/dL (ref 12.0–15.0)
Hemoglobin: 12.9 g/dL (ref 12.0–15.0)
Hemoglobin: 13 g/dL (ref 12.0–15.0)
Hemoglobin: 13 g/dL (ref 12.0–15.0)
Hemoglobin: 13.2 g/dL (ref 12.0–15.0)
Hemoglobin: 10.9 g/dL — ABNORMAL LOW (ref 12.0–15.0)
Hemoglobin: 11.4 g/dL — ABNORMAL LOW (ref 12.0–15.0)
MCHC: 34.3 g/dL (ref 30.0–36.0)
MCHC: 34.4 g/dL (ref 30.0–36.0)
MCHC: 34.4 g/dL (ref 30.0–36.0)
MCHC: 34.5 g/dL (ref 30.0–36.0)
MCHC: 34.5 g/dL (ref 30.0–36.0)
MCHC: 34.7 g/dL (ref 30.0–36.0)
MCHC: 33.5 g/dL (ref 30.0–36.0)
MCHC: 33.6 g/dL (ref 30.0–36.0)
MCV: 91.5 fL (ref 78.0–100.0)
MCV: 92.7 fL (ref 78.0–100.0)
MCV: 93 fL (ref 78.0–100.0)
MCV: 93.3 fL (ref 78.0–100.0)
MCV: 94.8 fL (ref 78.0–100.0)
Platelets: 161 10*3/uL (ref 150–400)
Platelets: 164 10*3/uL (ref 150–400)
Platelets: 228 10*3/uL (ref 150–400)
Platelets: 232 10*3/uL (ref 150–400)
Platelets: 248 10*3/uL (ref 150–400)
Platelets: 260 10*3/uL (ref 150–400)
RBC: 3.83 MIL/uL — ABNORMAL LOW (ref 3.87–5.11)
RBC: 3.92 MIL/uL (ref 3.87–5.11)
RBC: 3.96 MIL/uL (ref 3.87–5.11)
RBC: 4.03 MIL/uL (ref 3.87–5.11)
RBC: 4.08 MIL/uL (ref 3.87–5.11)
RBC: 4.17 MIL/uL (ref 3.87–5.11)
RBC: 3.43 MIL/uL — ABNORMAL LOW (ref 3.87–5.11)
RBC: 3.57 MIL/uL — ABNORMAL LOW (ref 3.87–5.11)
RDW: 13.2 % (ref 11.5–15.5)
RDW: 13.9 % (ref 11.5–15.5)
RDW: 14.1 % (ref 11.5–15.5)
WBC: 10.3 10*3/uL (ref 4.0–10.5)
WBC: 13 10*3/uL — ABNORMAL HIGH (ref 4.0–10.5)
WBC: 8.8 10*3/uL (ref 4.0–10.5)
WBC: 9 10*3/uL (ref 4.0–10.5)
WBC: 9.7 10*3/uL (ref 4.0–10.5)
WBC: 8 10*3/uL (ref 4.0–10.5)
WBC: 9.1 10*3/uL (ref 4.0–10.5)

## 2010-09-29 LAB — PROTIME-INR
INR: 1.12 (ref 0.00–1.49)
INR: 1.64 — ABNORMAL HIGH (ref 0.00–1.49)
INR: 1.64 — ABNORMAL HIGH (ref 0.00–1.49)
INR: 1.7 — ABNORMAL HIGH (ref 0.00–1.49)
INR: 1.71 — ABNORMAL HIGH (ref 0.00–1.49)
INR: 2.06 — ABNORMAL HIGH (ref 0.00–1.49)
INR: 2.52 — ABNORMAL HIGH (ref 0.00–1.49)
Prothrombin Time: 14 seconds (ref 11.6–15.2)
Prothrombin Time: 14.3 seconds (ref 11.6–15.2)
Prothrombin Time: 19.3 seconds — ABNORMAL HIGH (ref 11.6–15.2)
Prothrombin Time: 19.3 seconds — ABNORMAL HIGH (ref 11.6–15.2)
Prothrombin Time: 19.9 seconds — ABNORMAL HIGH (ref 11.6–15.2)
Prothrombin Time: 23 seconds — ABNORMAL HIGH (ref 11.6–15.2)
Prothrombin Time: 27 seconds — ABNORMAL HIGH (ref 11.6–15.2)

## 2010-09-29 LAB — COMPREHENSIVE METABOLIC PANEL
ALT: 16 U/L (ref 0–35)
ALT: 16 U/L (ref 0–35)
AST: 14 U/L (ref 0–37)
AST: 22 U/L (ref 0–37)
Albumin: 3.4 g/dL — ABNORMAL LOW (ref 3.5–5.2)
Albumin: 3.8 g/dL (ref 3.5–5.2)
Alkaline Phosphatase: 101 U/L (ref 39–117)
Alkaline Phosphatase: 92 U/L (ref 39–117)
BUN: 24 mg/dL — ABNORMAL HIGH (ref 6–23)
CO2: 20 mEq/L (ref 19–32)
Calcium: 8.8 mg/dL (ref 8.4–10.5)
Calcium: 9.1 mg/dL (ref 8.4–10.5)
Chloride: 106 mEq/L (ref 96–112)
Creatinine, Ser: 1.35 mg/dL — ABNORMAL HIGH (ref 0.4–1.2)
GFR calc Af Amer: 46 mL/min — ABNORMAL LOW (ref >60)
GFR calc Af Amer: >60 mL/min (ref >60)
GFR calc non Af Amer: 38 mL/min — ABNORMAL LOW (ref >60)
Glucose, Bld: 100 mg/dL — ABNORMAL HIGH (ref 70–99)
Glucose, Bld: 100 mg/dL — ABNORMAL HIGH (ref 70–99)
Potassium: 3.8 mEq/L (ref 3.5–5.1)
Potassium: 4.6 mEq/L (ref 3.5–5.1)
Sodium: 135 mEq/L (ref 135–145)
Sodium: 140 mEq/L (ref 135–145)
Total Bilirubin: 0.5 mg/dL (ref 0.3–1.2)
Total Protein: 7.4 g/dL (ref 6.0–8.3)
Total Protein: 8.2 g/dL (ref 6.0–8.3)

## 2010-09-29 LAB — URINALYSIS, ROUTINE W REFLEX MICROSCOPIC
Bilirubin Urine: NEGATIVE
Glucose, UA: NEGATIVE mg/dL
Ketones, ur: NEGATIVE mg/dL
Ketones, ur: NEGATIVE mg/dL
Leukocytes, UA: NEGATIVE
Nitrite: NEGATIVE
Protein, ur: NEGATIVE mg/dL
Protein, ur: NEGATIVE mg/dL
pH: 6 (ref 5.0–8.0)
pH: 6 (ref 5.0–8.0)

## 2010-09-29 LAB — MAGNESIUM
Magnesium: 1.9 mg/dL (ref 1.5–2.5)
Magnesium: 2.1 mg/dL (ref 1.5–2.5)

## 2010-09-29 LAB — CREATININE, SERUM
Creatinine, Ser: 0.99 mg/dL (ref 0.4–1.2)
GFR calc Af Amer: >60 mL/min (ref >60)
GFR calc non Af Amer: 55 mL/min — ABNORMAL LOW (ref >60)

## 2010-09-29 LAB — CARDIAC PANEL(CRET KIN+CKTOT+MB+TROPI)
Relative Index: INVALID (ref 0.0–2.5)
Relative Index: INVALID (ref 0.0–2.5)
Troponin I: <0.01 ng/mL (ref 0.00–0.06)

## 2010-09-29 LAB — PHOSPHORUS: Phosphorus: 3 mg/dL (ref 2.3–4.6)

## 2010-09-29 LAB — PHENYTOIN LEVEL, TOTAL: Phenytoin Lvl: 9.6 ug/mL — ABNORMAL LOW (ref 10.0–20.0)

## 2010-10-01 LAB — CARDIAC PANEL(CRET KIN+CKTOT+MB+TROPI)
CK, MB: 0.9 ng/mL (ref 0.3–4.0)
Relative Index: INVALID (ref 0.0–2.5)
Relative Index: INVALID (ref 0.0–2.5)
Total CK: 16 U/L (ref 7–177)
Troponin I: 0.01 ng/mL (ref 0.00–0.06)

## 2010-10-01 LAB — PROTIME-INR
INR: 0.95 (ref 0.00–1.49)
INR: 1.11 (ref 0.00–1.49)
INR: 1.91 — ABNORMAL HIGH (ref 0.00–1.49)
Prothrombin Time: 12.6 seconds (ref 11.6–15.2)
Prothrombin Time: 21.7 seconds — ABNORMAL HIGH (ref 11.6–15.2)

## 2010-10-01 LAB — COMPREHENSIVE METABOLIC PANEL
ALT: 43 U/L — ABNORMAL HIGH (ref 0–35)
AST: 15 U/L (ref 0–37)
AST: 23 U/L (ref 0–37)
Albumin: 3.1 g/dL — ABNORMAL LOW (ref 3.5–5.2)
Albumin: 3.3 g/dL — ABNORMAL LOW (ref 3.5–5.2)
Alkaline Phosphatase: 92 U/L (ref 39–117)
Alkaline Phosphatase: 93 U/L (ref 39–117)
BUN: 16 mg/dL (ref 6–23)
BUN: 21 mg/dL (ref 6–23)
Creatinine, Ser: 0.92 mg/dL (ref 0.4–1.2)
GFR calc Af Amer: >60 mL/min (ref >60)
GFR calc Af Amer: >60 mL/min (ref >60)
Potassium: 4.6 mEq/L (ref 3.5–5.1)
Potassium: 4.7 mEq/L (ref 3.5–5.1)
Sodium: 139 mEq/L (ref 135–145)
Total Protein: 5.9 g/dL — ABNORMAL LOW (ref 6.0–8.3)
Total Protein: 6.6 g/dL (ref 6.0–8.3)

## 2010-10-01 LAB — DIFFERENTIAL
Basophils Absolute: 0 10*3/uL (ref 0.0–0.1)
Basophils Absolute: 0 10*3/uL (ref 0.0–0.1)
Basophils Relative: 0 % (ref 0–1)
Basophils Relative: 0 % (ref 0–1)
Eosinophils Absolute: 0.1 10*3/uL (ref 0.0–0.7)
Eosinophils Absolute: 0.2 10*3/uL (ref 0.0–0.7)
Eosinophils Relative: 3 % (ref 0–5)
Eosinophils Relative: 3 % (ref 0–5)
Lymphocytes Relative: 10 % — ABNORMAL LOW (ref 12–46)
Monocytes Absolute: 0.3 10*3/uL (ref 0.1–1.0)
Monocytes Absolute: 0.3 10*3/uL (ref 0.1–1.0)
Monocytes Absolute: 0.4 10*3/uL (ref 0.1–1.0)
Monocytes Relative: 14 % — ABNORMAL HIGH (ref 3–12)
Monocytes Relative: 6 % (ref 3–12)
Neutro Abs: 1.5 10*3/uL — ABNORMAL LOW (ref 1.7–7.7)

## 2010-10-01 LAB — CBC
HCT: 33.9 % — ABNORMAL LOW (ref 36.0–46.0)
HCT: 36.5 % (ref 36.0–46.0)
HCT: 36.5 % (ref 36.0–46.0)
HCT: 38.2 % (ref 36.0–46.0)
Hemoglobin: 11.4 g/dL — ABNORMAL LOW (ref 12.0–15.0)
Hemoglobin: 12.5 g/dL (ref 12.0–15.0)
Hemoglobin: 13 g/dL (ref 12.0–15.0)
MCHC: 34.2 g/dL (ref 30.0–36.0)
MCV: 93.9 fL (ref 78.0–100.0)
MCV: 94.8 fL (ref 78.0–100.0)
Platelets: 178 10*3/uL (ref 150–400)
Platelets: 178 10*3/uL (ref 150–400)
RBC: 3.89 MIL/uL (ref 3.87–5.11)
RDW: 15.7 % — ABNORMAL HIGH (ref 11.5–15.5)
RDW: 15.8 % — ABNORMAL HIGH (ref 11.5–15.5)
RDW: 15.8 % — ABNORMAL HIGH (ref 11.5–15.5)
RDW: 16 % — ABNORMAL HIGH (ref 11.5–15.5)
RDW: 16.1 % — ABNORMAL HIGH (ref 11.5–15.5)
WBC: 2.8 10*3/uL — ABNORMAL LOW (ref 4.0–10.5)
WBC: 5.6 10*3/uL (ref 4.0–10.5)

## 2010-10-01 LAB — BASIC METABOLIC PANEL
GFR calc non Af Amer: 46 mL/min — ABNORMAL LOW (ref >60)
Glucose, Bld: 88 mg/dL (ref 70–99)
Potassium: 4.9 mEq/L (ref 3.5–5.1)
Sodium: 140 mEq/L (ref 135–145)

## 2010-10-01 LAB — POCT I-STAT, CHEM 8
Calcium, Ion: 1 mmol/L — ABNORMAL LOW (ref 1.12–1.32)
Chloride: 105 mEq/L (ref 96–112)
Glucose, Bld: 90 mg/dL (ref 70–99)
HCT: 37 % (ref 36.0–46.0)
Hemoglobin: 12.6 g/dL (ref 12.0–15.0)
TCO2: 27 mmol/L (ref 0–100)

## 2010-10-01 LAB — TSH: TSH: 1.644 u[IU]/mL (ref 0.350–4.500)

## 2010-10-01 LAB — LIPID PANEL
LDL Cholesterol: 162 mg/dL — ABNORMAL HIGH (ref 0–99)
Total CHOL/HDL Ratio: 3.8 RATIO
Triglycerides: 159 mg/dL — ABNORMAL HIGH (ref <150)
VLDL: 32 mg/dL (ref 0–40)

## 2010-10-01 LAB — POCT CARDIAC MARKERS: Troponin i, poc: <0.05 ng/mL (ref 0.00–0.09)

## 2010-10-04 LAB — DIFFERENTIAL
Basophils Relative: 1 % (ref 0–1)
Eosinophils Absolute: 0.1 10*3/uL (ref 0.0–0.7)
Monocytes Absolute: 0.3 10*3/uL (ref 0.1–1.0)
Monocytes Relative: 4 % (ref 3–12)
Neutrophils Relative %: 88 % — ABNORMAL HIGH (ref 43–77)

## 2010-10-04 LAB — COMPREHENSIVE METABOLIC PANEL
ALT: 84 U/L — ABNORMAL HIGH (ref 0–35)
Albumin: 3.5 g/dL (ref 3.5–5.2)
Alkaline Phosphatase: 172 U/L — ABNORMAL HIGH (ref 39–117)
Chloride: 101 mEq/L (ref 96–112)
Glucose, Bld: 115 mg/dL — ABNORMAL HIGH (ref 70–99)
Potassium: 4.4 mEq/L (ref 3.5–5.1)
Sodium: 137 mEq/L (ref 135–145)
Total Bilirubin: 0.8 mg/dL (ref 0.3–1.2)
Total Protein: 7 g/dL (ref 6.0–8.3)

## 2010-10-04 LAB — PROTIME-INR
INR: 1.72 — ABNORMAL HIGH (ref 0.00–1.49)
INR: 2.3 — ABNORMAL HIGH (ref 0.00–1.49)
INR: 2.58 — ABNORMAL HIGH (ref 0.00–1.49)
INR: 2.79 — ABNORMAL HIGH (ref 0.00–1.49)
Prothrombin Time: 18.9 seconds — ABNORMAL HIGH (ref 11.6–15.2)
Prothrombin Time: 25.1 seconds — ABNORMAL HIGH (ref 11.6–15.2)
Prothrombin Time: 29.2 seconds — ABNORMAL HIGH (ref 11.6–15.2)

## 2010-10-04 LAB — CBC
Hemoglobin: 11.8 g/dL — ABNORMAL LOW (ref 12.0–15.0)
Hemoglobin: 12.5 g/dL (ref 12.0–15.0)
MCV: 95.5 fL (ref 78.0–100.0)
RBC: 3.78 MIL/uL — ABNORMAL LOW (ref 3.87–5.11)
RBC: 3.82 MIL/uL — ABNORMAL LOW (ref 3.87–5.11)
RDW: 16 % — ABNORMAL HIGH (ref 11.5–15.5)
WBC: 3.6 10*3/uL — ABNORMAL LOW (ref 4.0–10.5)
WBC: 4.2 10*3/uL (ref 4.0–10.5)
WBC: 5.8 10*3/uL (ref 4.0–10.5)

## 2010-10-04 LAB — POCT CARDIAC MARKERS
Myoglobin, poc: 38.8 ng/mL (ref 12–200)
Troponin i, poc: <0.05 ng/mL (ref 0.00–0.09)

## 2010-10-04 LAB — BASIC METABOLIC PANEL
BUN: 20 mg/dL (ref 6–23)
CO2: 23 mEq/L (ref 19–32)
Chloride: 103 mEq/L (ref 96–112)
Creatinine, Ser: 0.97 mg/dL (ref 0.4–1.2)
GFR calc Af Amer: >60 mL/min (ref >60)
Potassium: 4.5 mEq/L (ref 3.5–5.1)

## 2010-10-04 LAB — POCT I-STAT, CHEM 8
BUN: 20 mg/dL (ref 6–23)
Chloride: 104 mEq/L (ref 96–112)
Potassium: 3.4 mEq/L — ABNORMAL LOW (ref 3.5–5.1)
Sodium: 136 mEq/L (ref 135–145)

## 2010-10-04 LAB — CARDIAC PANEL(CRET KIN+CKTOT+MB+TROPI)
CK, MB: 1.1 ng/mL (ref 0.3–4.0)
Relative Index: INVALID (ref 0.0–2.5)
Total CK: 104 U/L (ref 7–177)
Troponin I: 0.03 ng/mL (ref 0.00–0.06)

## 2010-10-16 LAB — COMPREHENSIVE METABOLIC PANEL
ALT: 28 U/L (ref 0–35)
ALT: 36 U/L — ABNORMAL HIGH (ref 0–35)
AST: 32 U/L (ref 0–37)
AST: 34 U/L (ref 0–37)
AST: 41 U/L — ABNORMAL HIGH (ref 0–37)
Albumin: 2.4 g/dL — ABNORMAL LOW (ref 3.5–5.2)
Albumin: 2.7 g/dL — ABNORMAL LOW (ref 3.5–5.2)
Albumin: 2.9 g/dL — ABNORMAL LOW (ref 3.5–5.2)
Alkaline Phosphatase: 121 U/L — ABNORMAL HIGH (ref 39–117)
BUN: 12 mg/dL (ref 6–23)
CO2: 26 mEq/L (ref 19–32)
Calcium: 8.2 mg/dL — ABNORMAL LOW (ref 8.4–10.5)
Calcium: 8.5 mg/dL (ref 8.4–10.5)
Chloride: 100 mEq/L (ref 96–112)
Chloride: 101 mEq/L (ref 96–112)
Creatinine, Ser: 1 mg/dL (ref 0.4–1.2)
Creatinine, Ser: 1.01 mg/dL (ref 0.4–1.2)
GFR calc Af Amer: 55 mL/min — ABNORMAL LOW (ref >60)
GFR calc Af Amer: >60 mL/min (ref >60)
GFR calc Af Amer: >60 mL/min (ref >60)
Potassium: 3.8 mEq/L (ref 3.5–5.1)
Sodium: 134 mEq/L — ABNORMAL LOW (ref 135–145)
Sodium: 135 mEq/L (ref 135–145)
Total Bilirubin: 2.5 mg/dL — ABNORMAL HIGH (ref 0.3–1.2)
Total Bilirubin: 2.7 mg/dL — ABNORMAL HIGH (ref 0.3–1.2)
Total Protein: 6.1 g/dL (ref 6.0–8.3)
Total Protein: 6.8 g/dL (ref 6.0–8.3)

## 2010-10-16 LAB — CBC
HCT: 30 % — ABNORMAL LOW (ref 36.0–46.0)
HCT: 30.5 % — ABNORMAL LOW (ref 36.0–46.0)
HCT: 32 % — ABNORMAL LOW (ref 36.0–46.0)
HCT: 32.5 % — ABNORMAL LOW (ref 36.0–46.0)
HCT: 32.6 % — ABNORMAL LOW (ref 36.0–46.0)
Hemoglobin: 10.5 g/dL — ABNORMAL LOW (ref 12.0–15.0)
Hemoglobin: 10.5 g/dL — ABNORMAL LOW (ref 12.0–15.0)
Hemoglobin: 10.7 g/dL — ABNORMAL LOW (ref 12.0–15.0)
Hemoglobin: 10.8 g/dL — ABNORMAL LOW (ref 12.0–15.0)
Hemoglobin: 10.9 g/dL — ABNORMAL LOW (ref 12.0–15.0)
Hemoglobin: 11 g/dL — ABNORMAL LOW (ref 12.0–15.0)
MCHC: 33.6 g/dL (ref 30.0–36.0)
MCHC: 33.7 g/dL (ref 30.0–36.0)
MCHC: 33.7 g/dL (ref 30.0–36.0)
MCHC: 34.4 g/dL (ref 30.0–36.0)
MCHC: 35.1 g/dL (ref 30.0–36.0)
MCV: 93.1 fL (ref 78.0–100.0)
MCV: 93.3 fL (ref 78.0–100.0)
MCV: 94.2 fL (ref 78.0–100.0)
MCV: 94.7 fL (ref 78.0–100.0)
MCV: 94.9 fL (ref 78.0–100.0)
Platelets: 221 10*3/uL (ref 150–400)
Platelets: 304 K/uL (ref 150–400)
Platelets: 377 K/uL (ref 150–400)
Platelets: 381 K/uL (ref 150–400)
Platelets: 397 K/uL (ref 150–400)
RBC: 3.22 MIL/uL — ABNORMAL LOW (ref 3.87–5.11)
RBC: 3.23 MIL/uL — ABNORMAL LOW (ref 3.87–5.11)
RBC: 3.33 MIL/uL — ABNORMAL LOW (ref 3.87–5.11)
RBC: 3.39 MIL/uL — ABNORMAL LOW (ref 3.87–5.11)
RBC: 3.44 MIL/uL — ABNORMAL LOW (ref 3.87–5.11)
RDW: 15.2 % (ref 11.5–15.5)
RDW: 15.4 % (ref 11.5–15.5)
RDW: 15.4 % (ref 11.5–15.5)
RDW: 15.4 % (ref 11.5–15.5)
RDW: 15.5 % (ref 11.5–15.5)
WBC: 6.9 10*3/uL (ref 4.0–10.5)
WBC: 8 K/uL (ref 4.0–10.5)
WBC: 8.1 K/uL (ref 4.0–10.5)
WBC: 8.2 K/uL (ref 4.0–10.5)
WBC: 8.5 K/uL (ref 4.0–10.5)

## 2010-10-16 LAB — COMPREHENSIVE METABOLIC PANEL WITH GFR
ALT: 26 U/L (ref 0–35)
ALT: 27 U/L (ref 0–35)
ALT: 29 U/L (ref 0–35)
AST: 25 U/L (ref 0–37)
AST: 29 U/L (ref 0–37)
AST: 44 U/L — ABNORMAL HIGH (ref 0–37)
Albumin: 2.4 g/dL — ABNORMAL LOW (ref 3.5–5.2)
Albumin: 2.5 g/dL — ABNORMAL LOW (ref 3.5–5.2)
Albumin: 2.5 g/dL — ABNORMAL LOW (ref 3.5–5.2)
Alkaline Phosphatase: 125 U/L — ABNORMAL HIGH (ref 39–117)
Alkaline Phosphatase: 126 U/L — ABNORMAL HIGH (ref 39–117)
Alkaline Phosphatase: 141 U/L — ABNORMAL HIGH (ref 39–117)
BUN: 17 mg/dL (ref 6–23)
BUN: 17 mg/dL (ref 6–23)
BUN: 18 mg/dL (ref 6–23)
CO2: 27 meq/L (ref 19–32)
CO2: 29 meq/L (ref 19–32)
CO2: 30 meq/L (ref 19–32)
Calcium: 8.4 mg/dL (ref 8.4–10.5)
Calcium: 8.5 mg/dL (ref 8.4–10.5)
Calcium: 8.6 mg/dL (ref 8.4–10.5)
Chloride: 101 meq/L (ref 96–112)
Chloride: 102 meq/L (ref 96–112)
Chloride: 104 meq/L (ref 96–112)
Creatinine, Ser: 1.14 mg/dL (ref 0.4–1.2)
Creatinine, Ser: 1.19 mg/dL (ref 0.4–1.2)
Creatinine, Ser: 1.28 mg/dL — ABNORMAL HIGH (ref 0.4–1.2)
GFR calc non Af Amer: 41 mL/min — ABNORMAL LOW
GFR calc non Af Amer: 44 mL/min — ABNORMAL LOW
GFR calc non Af Amer: 46 mL/min — ABNORMAL LOW
Glucose, Bld: 103 mg/dL — ABNORMAL HIGH (ref 70–99)
Glucose, Bld: 106 mg/dL — ABNORMAL HIGH (ref 70–99)
Glucose, Bld: 89 mg/dL (ref 70–99)
Potassium: 3.7 meq/L (ref 3.5–5.1)
Potassium: 4.4 meq/L (ref 3.5–5.1)
Potassium: 4.5 meq/L (ref 3.5–5.1)
Sodium: 137 meq/L (ref 135–145)
Sodium: 138 meq/L (ref 135–145)
Sodium: 138 meq/L (ref 135–145)
Total Bilirubin: 1.5 mg/dL — ABNORMAL HIGH (ref 0.3–1.2)
Total Bilirubin: 2.3 mg/dL — ABNORMAL HIGH (ref 0.3–1.2)
Total Bilirubin: 2.4 mg/dL — ABNORMAL HIGH (ref 0.3–1.2)
Total Protein: 5.7 g/dL — ABNORMAL LOW (ref 6.0–8.3)
Total Protein: 5.8 g/dL — ABNORMAL LOW (ref 6.0–8.3)
Total Protein: 6 g/dL (ref 6.0–8.3)

## 2010-10-16 LAB — DIFFERENTIAL
Eosinophils Relative: 1 % (ref 0–5)
Lymphocytes Relative: 15 % (ref 12–46)
Lymphs Abs: 1.3 10*3/uL (ref 0.7–4.0)
Monocytes Absolute: 0.7 10*3/uL (ref 0.1–1.0)
Monocytes Relative: 8 % (ref 3–12)
Neutro Abs: 6.3 10*3/uL (ref 1.7–7.7)

## 2010-10-16 LAB — URINALYSIS, ROUTINE W REFLEX MICROSCOPIC
Nitrite: POSITIVE — AB
Nitrite: POSITIVE — AB
Specific Gravity, Urine: 1.016 (ref 1.005–1.030)
Specific Gravity, Urine: 1.02 (ref 1.005–1.030)
Urobilinogen, UA: 1 mg/dL (ref 0.0–1.0)
pH: 6 (ref 5.0–8.0)
pH: 7 (ref 5.0–8.0)

## 2010-10-16 LAB — URINE MICROSCOPIC-ADD ON

## 2010-10-16 LAB — URINE CULTURE: Colony Count: >=100000

## 2010-10-16 LAB — PROTIME-INR: INR: 1.1 (ref 0.00–1.49)

## 2010-10-16 LAB — APTT: aPTT: 29 seconds (ref 24–37)

## 2010-10-17 LAB — BASIC METABOLIC PANEL
BUN: 19 mg/dL (ref 6–23)
BUN: 19 mg/dL (ref 6–23)
BUN: 21 mg/dL (ref 6–23)
BUN: 21 mg/dL (ref 6–23)
BUN: 23 mg/dL (ref 6–23)
BUN: 24 mg/dL — ABNORMAL HIGH (ref 6–23)
BUN: 25 mg/dL — ABNORMAL HIGH (ref 6–23)
BUN: 27 mg/dL — ABNORMAL HIGH (ref 6–23)
BUN: 29 mg/dL — ABNORMAL HIGH (ref 6–23)
BUN: 15 mg/dL (ref 6–23)
BUN: 15 mg/dL (ref 6–23)
CO2: 17 mEq/L — ABNORMAL LOW (ref 19–32)
CO2: 19 mEq/L (ref 19–32)
CO2: 20 mEq/L (ref 19–32)
CO2: 20 mEq/L (ref 19–32)
CO2: 20 mEq/L (ref 19–32)
CO2: 23 mEq/L (ref 19–32)
CO2: 23 mEq/L (ref 19–32)
CO2: 24 mEq/L (ref 19–32)
CO2: 26 mEq/L (ref 19–32)
CO2: 27 mEq/L (ref 19–32)
CO2: 27 mEq/L (ref 19–32)
CO2: 28 mEq/L (ref 19–32)
CO2: 26 mEq/L (ref 19–32)
CO2: 28 mEq/L (ref 19–32)
Calcium: 7.3 mg/dL — ABNORMAL LOW (ref 8.4–10.5)
Calcium: 7.5 mg/dL — ABNORMAL LOW (ref 8.4–10.5)
Calcium: 7.6 mg/dL — ABNORMAL LOW (ref 8.4–10.5)
Calcium: 7.6 mg/dL — ABNORMAL LOW (ref 8.4–10.5)
Calcium: 7.8 mg/dL — ABNORMAL LOW (ref 8.4–10.5)
Calcium: 7.9 mg/dL — ABNORMAL LOW (ref 8.4–10.5)
Calcium: 7.9 mg/dL — ABNORMAL LOW (ref 8.4–10.5)
Calcium: 8 mg/dL — ABNORMAL LOW (ref 8.4–10.5)
Calcium: 8 mg/dL — ABNORMAL LOW (ref 8.4–10.5)
Calcium: 8 mg/dL — ABNORMAL LOW (ref 8.4–10.5)
Calcium: 8.1 mg/dL — ABNORMAL LOW (ref 8.4–10.5)
Calcium: 8.2 mg/dL — ABNORMAL LOW (ref 8.4–10.5)
Calcium: 9 mg/dL (ref 8.4–10.5)
Calcium: 9.3 mg/dL (ref 8.4–10.5)
Chloride: 101 mEq/L (ref 96–112)
Chloride: 103 mEq/L (ref 96–112)
Chloride: 103 mEq/L (ref 96–112)
Chloride: 103 mEq/L (ref 96–112)
Chloride: 105 mEq/L (ref 96–112)
Chloride: 105 mEq/L (ref 96–112)
Chloride: 106 mEq/L (ref 96–112)
Chloride: 106 mEq/L (ref 96–112)
Chloride: 109 mEq/L (ref 96–112)
Chloride: 110 mEq/L (ref 96–112)
Chloride: 98 mEq/L (ref 96–112)
Chloride: 105 mEq/L (ref 96–112)
Creatinine, Ser: 1.05 mg/dL (ref 0.4–1.2)
Creatinine, Ser: 1.1 mg/dL (ref 0.4–1.2)
Creatinine, Ser: 1.15 mg/dL (ref 0.4–1.2)
Creatinine, Ser: 1.15 mg/dL (ref 0.4–1.2)
Creatinine, Ser: 1.17 mg/dL (ref 0.4–1.2)
Creatinine, Ser: 1.31 mg/dL — ABNORMAL HIGH (ref 0.4–1.2)
Creatinine, Ser: 1.41 mg/dL — ABNORMAL HIGH (ref 0.4–1.2)
Creatinine, Ser: 1.64 mg/dL — ABNORMAL HIGH (ref 0.4–1.2)
Creatinine, Ser: 1.73 mg/dL — ABNORMAL HIGH (ref 0.4–1.2)
Creatinine, Ser: 2.21 mg/dL — ABNORMAL HIGH (ref 0.4–1.2)
Creatinine, Ser: 0.93 mg/dL (ref 0.4–1.2)
Creatinine, Ser: 0.98 mg/dL (ref 0.4–1.2)
Creatinine, Ser: 1.05 mg/dL (ref 0.4–1.2)
GFR calc Af Amer: 36 mL/min — ABNORMAL LOW (ref >60)
GFR calc Af Amer: 37 mL/min — ABNORMAL LOW (ref >60)
GFR calc Af Amer: 44 mL/min — ABNORMAL LOW (ref >60)
GFR calc Af Amer: 47 mL/min — ABNORMAL LOW (ref >60)
GFR calc Af Amer: 48 mL/min — ABNORMAL LOW (ref >60)
GFR calc Af Amer: 55 mL/min — ABNORMAL LOW (ref >60)
GFR calc Af Amer: 56 mL/min — ABNORMAL LOW (ref >60)
GFR calc Af Amer: 57 mL/min — ABNORMAL LOW (ref >60)
GFR calc Af Amer: 58 mL/min — ABNORMAL LOW (ref >60)
GFR calc Af Amer: 60 mL/min — ABNORMAL LOW (ref >60)
GFR calc Af Amer: >60 mL/min (ref >60)
GFR calc Af Amer: >60 mL/min (ref >60)
GFR calc Af Amer: >60 mL/min (ref >60)
GFR calc non Af Amer: 22 mL/min — ABNORMAL LOW (ref >60)
GFR calc non Af Amer: 23 mL/min — ABNORMAL LOW (ref >60)
GFR calc non Af Amer: 30 mL/min — ABNORMAL LOW (ref >60)
GFR calc non Af Amer: 45 mL/min — ABNORMAL LOW (ref >60)
GFR calc non Af Amer: 45 mL/min — ABNORMAL LOW (ref >60)
GFR calc non Af Amer: 47 mL/min — ABNORMAL LOW (ref >60)
GFR calc non Af Amer: 48 mL/min — ABNORMAL LOW (ref >60)
GFR calc non Af Amer: 48 mL/min — ABNORMAL LOW (ref >60)
GFR calc non Af Amer: 55 mL/min — ABNORMAL LOW (ref >60)
Glucose, Bld: 108 mg/dL — ABNORMAL HIGH (ref 70–99)
Glucose, Bld: 109 mg/dL — ABNORMAL HIGH (ref 70–99)
Glucose, Bld: 111 mg/dL — ABNORMAL HIGH (ref 70–99)
Glucose, Bld: 117 mg/dL — ABNORMAL HIGH (ref 70–99)
Glucose, Bld: 119 mg/dL — ABNORMAL HIGH (ref 70–99)
Glucose, Bld: 119 mg/dL — ABNORMAL HIGH (ref 70–99)
Glucose, Bld: 132 mg/dL — ABNORMAL HIGH (ref 70–99)
Glucose, Bld: 135 mg/dL — ABNORMAL HIGH (ref 70–99)
Glucose, Bld: 145 mg/dL — ABNORMAL HIGH (ref 70–99)
Glucose, Bld: 161 mg/dL — ABNORMAL HIGH (ref 70–99)
Glucose, Bld: 113 mg/dL — ABNORMAL HIGH (ref 70–99)
Potassium: 3.2 mEq/L — ABNORMAL LOW (ref 3.5–5.1)
Potassium: 3.5 mEq/L (ref 3.5–5.1)
Potassium: 3.6 mEq/L (ref 3.5–5.1)
Potassium: 3.7 mEq/L (ref 3.5–5.1)
Potassium: 3.8 mEq/L (ref 3.5–5.1)
Potassium: 3.9 mEq/L (ref 3.5–5.1)
Potassium: 5 mEq/L (ref 3.5–5.1)
Potassium: 5.2 mEq/L — ABNORMAL HIGH (ref 3.5–5.1)
Potassium: 5.3 mEq/L — ABNORMAL HIGH (ref 3.5–5.1)
Potassium: 5.4 mEq/L — ABNORMAL HIGH (ref 3.5–5.1)
Potassium: 3.9 mEq/L (ref 3.5–5.1)
Sodium: 130 mEq/L — ABNORMAL LOW (ref 135–145)
Sodium: 131 mEq/L — ABNORMAL LOW (ref 135–145)
Sodium: 132 mEq/L — ABNORMAL LOW (ref 135–145)
Sodium: 133 mEq/L — ABNORMAL LOW (ref 135–145)
Sodium: 133 mEq/L — ABNORMAL LOW (ref 135–145)
Sodium: 133 mEq/L — ABNORMAL LOW (ref 135–145)
Sodium: 135 mEq/L (ref 135–145)
Sodium: 135 mEq/L (ref 135–145)
Sodium: 136 mEq/L (ref 135–145)
Sodium: 136 mEq/L (ref 135–145)
Sodium: 131 mEq/L — ABNORMAL LOW (ref 135–145)
Sodium: 132 mEq/L — ABNORMAL LOW (ref 135–145)
Sodium: 138 mEq/L (ref 135–145)

## 2010-10-17 LAB — APTT
aPTT: 25 seconds (ref 24–37)
aPTT: 26 seconds (ref 24–37)
aPTT: 75 seconds — ABNORMAL HIGH (ref 24–37)

## 2010-10-17 LAB — CROSSMATCH
ABO/RH(D): A POS
Antibody Screen: NEGATIVE
Antibody Screen: NEGATIVE

## 2010-10-17 LAB — CBC
HCT: 24.9 % — ABNORMAL LOW (ref 36.0–46.0)
HCT: 24.9 % — ABNORMAL LOW (ref 36.0–46.0)
HCT: 26 % — ABNORMAL LOW (ref 36.0–46.0)
HCT: 26.1 % — ABNORMAL LOW (ref 36.0–46.0)
HCT: 26.1 % — ABNORMAL LOW (ref 36.0–46.0)
HCT: 26.7 % — ABNORMAL LOW (ref 36.0–46.0)
HCT: 27 % — ABNORMAL LOW (ref 36.0–46.0)
HCT: 28.5 % — ABNORMAL LOW (ref 36.0–46.0)
HCT: 28.5 % — ABNORMAL LOW (ref 36.0–46.0)
HCT: 30.3 % — ABNORMAL LOW (ref 36.0–46.0)
HCT: 37.6 % (ref 36.0–46.0)
HCT: 34.1 % — ABNORMAL LOW (ref 36.0–46.0)
HCT: 34.1 % — ABNORMAL LOW (ref 36.0–46.0)
HCT: 36.1 % (ref 36.0–46.0)
HCT: 36.7 % (ref 36.0–46.0)
HCT: 38.1 % (ref 36.0–46.0)
Hemoglobin: 10 g/dL — ABNORMAL LOW (ref 12.0–15.0)
Hemoglobin: 10 g/dL — ABNORMAL LOW (ref 12.0–15.0)
Hemoglobin: 10.5 g/dL — ABNORMAL LOW (ref 12.0–15.0)
Hemoglobin: 12.9 g/dL (ref 12.0–15.0)
Hemoglobin: 8.5 g/dL — ABNORMAL LOW (ref 12.0–15.0)
Hemoglobin: 8.8 g/dL — ABNORMAL LOW (ref 12.0–15.0)
Hemoglobin: 9 g/dL — ABNORMAL LOW (ref 12.0–15.0)
Hemoglobin: 9.1 g/dL — ABNORMAL LOW (ref 12.0–15.0)
Hemoglobin: 9.2 g/dL — ABNORMAL LOW (ref 12.0–15.0)
Hemoglobin: 9.2 g/dL — ABNORMAL LOW (ref 12.0–15.0)
Hemoglobin: 9.3 g/dL — ABNORMAL LOW (ref 12.0–15.0)
Hemoglobin: 9.6 g/dL — ABNORMAL LOW (ref 12.0–15.0)
Hemoglobin: 9.6 g/dL — ABNORMAL LOW (ref 12.0–15.0)
Hemoglobin: 9.6 g/dL — ABNORMAL LOW (ref 12.0–15.0)
Hemoglobin: 11.7 g/dL — ABNORMAL LOW (ref 12.0–15.0)
Hemoglobin: 11.7 g/dL — ABNORMAL LOW (ref 12.0–15.0)
Hemoglobin: 11.8 g/dL — ABNORMAL LOW (ref 12.0–15.0)
Hemoglobin: 11.9 g/dL — ABNORMAL LOW (ref 12.0–15.0)
Hemoglobin: 12.5 g/dL (ref 12.0–15.0)
Hemoglobin: 12.8 g/dL (ref 12.0–15.0)
Hemoglobin: 13.2 g/dL (ref 12.0–15.0)
MCHC: 33.3 g/dL (ref 30.0–36.0)
MCHC: 34 g/dL (ref 30.0–36.0)
MCHC: 34.1 g/dL (ref 30.0–36.0)
MCHC: 34.2 g/dL (ref 30.0–36.0)
MCHC: 34.3 g/dL (ref 30.0–36.0)
MCHC: 34.3 g/dL (ref 30.0–36.0)
MCHC: 34.6 g/dL (ref 30.0–36.0)
MCHC: 34.6 g/dL (ref 30.0–36.0)
MCHC: 34.8 g/dL (ref 30.0–36.0)
MCHC: 35 g/dL (ref 30.0–36.0)
MCHC: 35 g/dL (ref 30.0–36.0)
MCHC: 35.2 g/dL (ref 30.0–36.0)
MCHC: 35.2 g/dL (ref 30.0–36.0)
MCHC: 35.2 g/dL (ref 30.0–36.0)
MCHC: 35.3 g/dL (ref 30.0–36.0)
MCHC: 35.5 g/dL (ref 30.0–36.0)
MCHC: 35.6 g/dL (ref 30.0–36.0)
MCHC: 33.8 g/dL (ref 30.0–36.0)
MCHC: 34.8 g/dL (ref 30.0–36.0)
MCHC: 34.8 g/dL (ref 30.0–36.0)
MCHC: 35 g/dL (ref 30.0–36.0)
MCV: 89.6 fL (ref 78.0–100.0)
MCV: 90 fL (ref 78.0–100.0)
MCV: 90.4 fL (ref 78.0–100.0)
MCV: 91.4 fL (ref 78.0–100.0)
MCV: 92.2 fL (ref 78.0–100.0)
MCV: 92.6 fL (ref 78.0–100.0)
MCV: 93.3 fL (ref 78.0–100.0)
MCV: 93.8 fL (ref 78.0–100.0)
MCV: 93.9 fL (ref 78.0–100.0)
MCV: 94.3 fL (ref 78.0–100.0)
MCV: 89.9 fL (ref 78.0–100.0)
MCV: 90.4 fL (ref 78.0–100.0)
MCV: 90.9 fL (ref 78.0–100.0)
MCV: 90.9 fL (ref 78.0–100.0)
MCV: 91 fL (ref 78.0–100.0)
MCV: 91.1 fL (ref 78.0–100.0)
MCV: 91.9 fL (ref 78.0–100.0)
Platelets: 106 10*3/uL — ABNORMAL LOW (ref 150–400)
Platelets: 117 10*3/uL — ABNORMAL LOW (ref 150–400)
Platelets: 127 10*3/uL — ABNORMAL LOW (ref 150–400)
Platelets: 151 10*3/uL (ref 150–400)
Platelets: 153 10*3/uL (ref 150–400)
Platelets: 192 10*3/uL (ref 150–400)
Platelets: 202 10*3/uL (ref 150–400)
Platelets: 227 10*3/uL (ref 150–400)
Platelets: 237 10*3/uL (ref 150–400)
Platelets: 262 10*3/uL (ref 150–400)
Platelets: 329 10*3/uL (ref 150–400)
Platelets: 382 10*3/uL (ref 150–400)
Platelets: 201 10*3/uL (ref 150–400)
Platelets: 209 10*3/uL (ref 150–400)
Platelets: 210 10*3/uL (ref 150–400)
Platelets: 214 10*3/uL (ref 150–400)
Platelets: 233 10*3/uL (ref 150–400)
RBC: 2.64 MIL/uL — ABNORMAL LOW (ref 3.87–5.11)
RBC: 2.65 MIL/uL — ABNORMAL LOW (ref 3.87–5.11)
RBC: 2.68 MIL/uL — ABNORMAL LOW (ref 3.87–5.11)
RBC: 2.71 MIL/uL — ABNORMAL LOW (ref 3.87–5.11)
RBC: 2.76 MIL/uL — ABNORMAL LOW (ref 3.87–5.11)
RBC: 2.83 MIL/uL — ABNORMAL LOW (ref 3.87–5.11)
RBC: 2.85 MIL/uL — ABNORMAL LOW (ref 3.87–5.11)
RBC: 2.86 MIL/uL — ABNORMAL LOW (ref 3.87–5.11)
RBC: 2.89 MIL/uL — ABNORMAL LOW (ref 3.87–5.11)
RBC: 2.98 MIL/uL — ABNORMAL LOW (ref 3.87–5.11)
RBC: 2.99 MIL/uL — ABNORMAL LOW (ref 3.87–5.11)
RBC: 3.07 MIL/uL — ABNORMAL LOW (ref 3.87–5.11)
RBC: 3.08 MIL/uL — ABNORMAL LOW (ref 3.87–5.11)
RBC: 3.35 MIL/uL — ABNORMAL LOW (ref 3.87–5.11)
RBC: 3.52 MIL/uL — ABNORMAL LOW (ref 3.87–5.11)
RBC: 3.59 MIL/uL — ABNORMAL LOW (ref 3.87–5.11)
RBC: 3.76 MIL/uL — ABNORMAL LOW (ref 3.87–5.11)
RBC: 3.69 MIL/uL — ABNORMAL LOW (ref 3.87–5.11)
RBC: 3.76 MIL/uL — ABNORMAL LOW (ref 3.87–5.11)
RBC: 3.78 MIL/uL — ABNORMAL LOW (ref 3.87–5.11)
RBC: 3.79 MIL/uL — ABNORMAL LOW (ref 3.87–5.11)
RBC: 3.88 MIL/uL (ref 3.87–5.11)
RBC: 4.17 MIL/uL (ref 3.87–5.11)
RDW: 14.1 % (ref 11.5–15.5)
RDW: 14.3 % (ref 11.5–15.5)
RDW: 14.3 % (ref 11.5–15.5)
RDW: 14.4 % (ref 11.5–15.5)
RDW: 14.6 % (ref 11.5–15.5)
RDW: 14.8 % (ref 11.5–15.5)
RDW: 14.9 % (ref 11.5–15.5)
RDW: 15.1 % (ref 11.5–15.5)
RDW: 15.2 % (ref 11.5–15.5)
RDW: 15.3 % (ref 11.5–15.5)
RDW: 15.3 % (ref 11.5–15.5)
RDW: 15.3 % (ref 11.5–15.5)
RDW: 15.4 % (ref 11.5–15.5)
RDW: 15.9 % — ABNORMAL HIGH (ref 11.5–15.5)
RDW: 13.4 % (ref 11.5–15.5)
RDW: 13.5 % (ref 11.5–15.5)
RDW: 13.5 % (ref 11.5–15.5)
RDW: 13.5 % (ref 11.5–15.5)
RDW: 13.9 % (ref 11.5–15.5)
WBC: 10.6 10*3/uL — ABNORMAL HIGH (ref 4.0–10.5)
WBC: 14 10*3/uL — ABNORMAL HIGH (ref 4.0–10.5)
WBC: 16.6 10*3/uL — ABNORMAL HIGH (ref 4.0–10.5)
WBC: 4 10*3/uL (ref 4.0–10.5)
WBC: 4.1 10*3/uL (ref 4.0–10.5)
WBC: 5.6 10*3/uL (ref 4.0–10.5)
WBC: 5.7 10*3/uL (ref 4.0–10.5)
WBC: 6.1 10*3/uL (ref 4.0–10.5)
WBC: 7 10*3/uL (ref 4.0–10.5)
WBC: 8.1 10*3/uL (ref 4.0–10.5)
WBC: 8.2 10*3/uL (ref 4.0–10.5)
WBC: 8.7 10*3/uL (ref 4.0–10.5)
WBC: 9.1 10*3/uL (ref 4.0–10.5)
WBC: 9.4 10*3/uL (ref 4.0–10.5)
WBC: 5.6 10*3/uL (ref 4.0–10.5)
WBC: 5.9 10*3/uL (ref 4.0–10.5)
WBC: 6.3 10*3/uL (ref 4.0–10.5)
WBC: 6.8 10*3/uL (ref 4.0–10.5)
WBC: 6.9 10*3/uL (ref 4.0–10.5)
WBC: 7 10*3/uL (ref 4.0–10.5)
WBC: 7.5 10*3/uL (ref 4.0–10.5)
WBC: 7.8 10*3/uL (ref 4.0–10.5)

## 2010-10-17 LAB — BRAIN NATRIURETIC PEPTIDE
Pro B Natriuretic peptide (BNP): 212 pg/mL — ABNORMAL HIGH (ref 0.0–100.0)
Pro B Natriuretic peptide (BNP): 338 pg/mL — ABNORMAL HIGH (ref 0.0–100.0)
Pro B Natriuretic peptide (BNP): 421 pg/mL — ABNORMAL HIGH (ref 0.0–100.0)
Pro B Natriuretic peptide (BNP): 515 pg/mL — ABNORMAL HIGH (ref 0.0–100.0)
Pro B Natriuretic peptide (BNP): 149 pg/mL — ABNORMAL HIGH (ref 0.0–100.0)

## 2010-10-17 LAB — DIFFERENTIAL
Basophils Absolute: 0 10*3/uL (ref 0.0–0.1)
Basophils Relative: 1 % (ref 0–1)
Lymphocytes Relative: 26 % (ref 12–46)
Monocytes Absolute: 0.5 10*3/uL (ref 0.1–1.0)
Neutro Abs: 5.1 10*3/uL (ref 1.7–7.7)
Neutrophils Relative %: 65 % (ref 43–77)

## 2010-10-17 LAB — COMPREHENSIVE METABOLIC PANEL
ALT: 14 U/L (ref 0–35)
ALT: 18 U/L (ref 0–35)
ALT: 19 U/L (ref 0–35)
ALT: 24 U/L (ref 0–35)
ALT: 27 U/L (ref 0–35)
AST: 17 U/L (ref 0–37)
AST: 19 U/L (ref 0–37)
AST: 25 U/L (ref 0–37)
AST: 41 U/L — ABNORMAL HIGH (ref 0–37)
Albumin: 2.2 g/dL — ABNORMAL LOW (ref 3.5–5.2)
Albumin: 2.3 g/dL — ABNORMAL LOW (ref 3.5–5.2)
Albumin: 2.3 g/dL — ABNORMAL LOW (ref 3.5–5.2)
Albumin: 2.5 g/dL — ABNORMAL LOW (ref 3.5–5.2)
Albumin: 2.6 g/dL — ABNORMAL LOW (ref 3.5–5.2)
Albumin: 3.5 g/dL (ref 3.5–5.2)
Alkaline Phosphatase: 116 U/L (ref 39–117)
Alkaline Phosphatase: 119 U/L — ABNORMAL HIGH (ref 39–117)
Alkaline Phosphatase: 68 U/L (ref 39–117)
Alkaline Phosphatase: 92 U/L (ref 39–117)
Alkaline Phosphatase: 93 U/L (ref 39–117)
Alkaline Phosphatase: 96 U/L (ref 39–117)
BUN: 17 mg/dL (ref 6–23)
BUN: 21 mg/dL (ref 6–23)
BUN: 25 mg/dL — ABNORMAL HIGH (ref 6–23)
BUN: 13 mg/dL (ref 6–23)
CO2: 23 mEq/L (ref 19–32)
CO2: 26 mEq/L (ref 19–32)
CO2: 26 mEq/L (ref 19–32)
CO2: 27 mEq/L (ref 19–32)
CO2: 28 mEq/L (ref 19–32)
Calcium: 7.8 mg/dL — ABNORMAL LOW (ref 8.4–10.5)
Calcium: 8 mg/dL — ABNORMAL LOW (ref 8.4–10.5)
Calcium: 8 mg/dL — ABNORMAL LOW (ref 8.4–10.5)
Calcium: 8 mg/dL — ABNORMAL LOW (ref 8.4–10.5)
Calcium: 8.1 mg/dL — ABNORMAL LOW (ref 8.4–10.5)
Calcium: 8.2 mg/dL — ABNORMAL LOW (ref 8.4–10.5)
Chloride: 101 mEq/L (ref 96–112)
Chloride: 103 mEq/L (ref 96–112)
Chloride: 105 mEq/L (ref 96–112)
Creatinine, Ser: 1.13 mg/dL (ref 0.4–1.2)
Creatinine, Ser: 1.21 mg/dL — ABNORMAL HIGH (ref 0.4–1.2)
Creatinine, Ser: 1.32 mg/dL — ABNORMAL HIGH (ref 0.4–1.2)
Creatinine, Ser: 0.91 mg/dL (ref 0.4–1.2)
GFR calc Af Amer: 47 mL/min — ABNORMAL LOW (ref >60)
GFR calc Af Amer: 50 mL/min — ABNORMAL LOW (ref >60)
GFR calc Af Amer: 51 mL/min — ABNORMAL LOW (ref >60)
GFR calc Af Amer: 57 mL/min — ABNORMAL LOW (ref >60)
GFR calc non Af Amer: 42 mL/min — ABNORMAL LOW (ref >60)
GFR calc non Af Amer: 47 mL/min — ABNORMAL LOW (ref >60)
GFR calc non Af Amer: 48 mL/min — ABNORMAL LOW (ref >60)
GFR calc non Af Amer: >60 mL/min (ref >60)
Glucose, Bld: 107 mg/dL — ABNORMAL HIGH (ref 70–99)
Glucose, Bld: 116 mg/dL — ABNORMAL HIGH (ref 70–99)
Glucose, Bld: 117 mg/dL — ABNORMAL HIGH (ref 70–99)
Glucose, Bld: 118 mg/dL — ABNORMAL HIGH (ref 70–99)
Glucose, Bld: 127 mg/dL — ABNORMAL HIGH (ref 70–99)
Glucose, Bld: 127 mg/dL — ABNORMAL HIGH (ref 70–99)
Glucose, Bld: 77 mg/dL (ref 70–99)
Glucose, Bld: 130 mg/dL — ABNORMAL HIGH (ref 70–99)
Potassium: 3.4 mEq/L — ABNORMAL LOW (ref 3.5–5.1)
Potassium: 3.4 mEq/L — ABNORMAL LOW (ref 3.5–5.1)
Potassium: 3.7 mEq/L (ref 3.5–5.1)
Potassium: 3.8 mEq/L (ref 3.5–5.1)
Potassium: 4 mEq/L (ref 3.5–5.1)
Potassium: 4.4 mEq/L (ref 3.5–5.1)
Sodium: 129 mEq/L — ABNORMAL LOW (ref 135–145)
Sodium: 131 mEq/L — ABNORMAL LOW (ref 135–145)
Sodium: 131 mEq/L — ABNORMAL LOW (ref 135–145)
Sodium: 134 mEq/L — ABNORMAL LOW (ref 135–145)
Sodium: 134 mEq/L — ABNORMAL LOW (ref 135–145)
Total Bilirubin: 3.2 mg/dL — ABNORMAL HIGH (ref 0.3–1.2)
Total Bilirubin: 0.3 mg/dL (ref 0.3–1.2)
Total Protein: 4.9 g/dL — ABNORMAL LOW (ref 6.0–8.3)
Total Protein: 5.4 g/dL — ABNORMAL LOW (ref 6.0–8.3)
Total Protein: 5.7 g/dL — ABNORMAL LOW (ref 6.0–8.3)
Total Protein: 5.7 g/dL — ABNORMAL LOW (ref 6.0–8.3)
Total Protein: 5.7 g/dL — ABNORMAL LOW (ref 6.0–8.3)
Total Protein: 5.7 g/dL — ABNORMAL LOW (ref 6.0–8.3)

## 2010-10-17 LAB — FECAL LACTOFERRIN, QUANT: Fecal Lactoferrin: POSITIVE

## 2010-10-17 LAB — HEMOGLOBIN A1C
Hgb A1c MFr Bld: 5.6 % (ref 4.6–6.1)
Mean Plasma Glucose: 114 mg/dL

## 2010-10-17 LAB — LIPID PANEL
Cholesterol: 237 mg/dL — ABNORMAL HIGH (ref 0–200)
LDL Cholesterol: 156 mg/dL — ABNORMAL HIGH (ref 0–99)
Total CHOL/HDL Ratio: 5.8 RATIO
Triglycerides: 199 mg/dL — ABNORMAL HIGH (ref <150)
VLDL: 40 mg/dL (ref 0–40)

## 2010-10-17 LAB — URINE CULTURE: Special Requests: NEGATIVE

## 2010-10-17 LAB — MAGNESIUM
Magnesium: 1.7 mg/dL (ref 1.5–2.5)
Magnesium: 1.8 mg/dL (ref 1.5–2.5)
Magnesium: 2 mg/dL (ref 1.5–2.5)
Magnesium: 2.1 mg/dL (ref 1.5–2.5)
Magnesium: 2.1 mg/dL (ref 1.5–2.5)

## 2010-10-17 LAB — URINALYSIS, ROUTINE W REFLEX MICROSCOPIC
Bilirubin Urine: NEGATIVE
Glucose, UA: NEGATIVE mg/dL
Hgb urine dipstick: NEGATIVE
Ketones, ur: NEGATIVE mg/dL
Protein, ur: NEGATIVE mg/dL
Urobilinogen, UA: 0.2 mg/dL (ref 0.0–1.0)

## 2010-10-17 LAB — PREPARE FRESH FROZEN PLASMA

## 2010-10-17 LAB — LIPASE, BLOOD: Lipase: 41 U/L (ref 11–59)

## 2010-10-17 LAB — HEPATIC FUNCTION PANEL
AST: 40 U/L — ABNORMAL HIGH (ref 0–37)
Albumin: 2.2 g/dL — ABNORMAL LOW (ref 3.5–5.2)
Bilirubin, Direct: 4.5 mg/dL — ABNORMAL HIGH (ref 0.0–0.3)
Indirect Bilirubin: 2.3 mg/dL — ABNORMAL HIGH (ref 0.3–0.9)
Indirect Bilirubin: 2.9 mg/dL — ABNORMAL HIGH (ref 0.3–0.9)
Total Bilirubin: 7.4 mg/dL — ABNORMAL HIGH (ref 0.3–1.2)
Total Protein: 5.4 g/dL — ABNORMAL LOW (ref 6.0–8.3)

## 2010-10-17 LAB — PREPARE RBC (CROSSMATCH)

## 2010-10-17 LAB — POCT CARDIAC MARKERS
CKMB, poc: 3.1 ng/mL (ref 1.0–8.0)
CKMB, poc: 3.3 ng/mL (ref 1.0–8.0)
CKMB, poc: 6.2 ng/mL (ref 1.0–8.0)
Myoglobin, poc: 107 ng/mL (ref 12–200)
Myoglobin, poc: 108 ng/mL (ref 12–200)

## 2010-10-17 LAB — CARDIAC PANEL(CRET KIN+CKTOT+MB+TROPI)
CK, MB: 1.1 ng/mL (ref 0.3–4.0)
CK, MB: 1.2 ng/mL (ref 0.3–4.0)
CK, MB: 8.5 ng/mL — ABNORMAL HIGH (ref 0.3–4.0)
Relative Index: INVALID (ref 0.0–2.5)
Relative Index: INVALID (ref 0.0–2.5)
Relative Index: INVALID (ref 0.0–2.5)
Relative Index: 10 — ABNORMAL HIGH (ref 0.0–2.5)
Relative Index: INVALID (ref 0.0–2.5)
Total CK: 22 U/L (ref 7–177)
Total CK: 34 U/L (ref 7–177)
Total CK: 68 U/L (ref 7–177)
Total CK: 94 U/L (ref 7–177)
Troponin I: 0.03 ng/mL (ref 0.00–0.06)
Troponin I: 0.03 ng/mL (ref 0.00–0.06)
Troponin I: 0.06 ng/mL (ref 0.00–0.06)
Troponin I: 1.13 ng/mL (ref 0.00–0.06)

## 2010-10-17 LAB — PROTIME-INR
INR: 1.2 (ref 0.00–1.49)
INR: 0.9 (ref 0.00–1.49)
INR: 1 (ref 0.00–1.49)
Prothrombin Time: 13.1 seconds (ref 11.6–15.2)

## 2010-10-17 LAB — PHOSPHORUS: Phosphorus: 3.2 mg/dL (ref 2.3–4.6)

## 2010-10-17 LAB — HEPARIN LEVEL (UNFRACTIONATED)
Heparin Unfractionated: 0.23 IU/mL — ABNORMAL LOW (ref 0.30–0.70)
Heparin Unfractionated: 0.29 IU/mL — ABNORMAL LOW (ref 0.30–0.70)
Heparin Unfractionated: 0.4 IU/mL (ref 0.30–0.70)
Heparin Unfractionated: 0.63 IU/mL (ref 0.30–0.70)

## 2010-10-17 LAB — STOOL CULTURE

## 2010-10-17 LAB — POCT I-STAT EG7
Acid-base deficit: 6 mmol/L — ABNORMAL HIGH (ref 0.0–2.0)
Calcium, Ion: 1.1 mmol/L — ABNORMAL LOW (ref 1.12–1.32)
HCT: 29 % — ABNORMAL LOW (ref 36.0–46.0)
O2 Saturation: 66 %
Potassium: 4.4 mEq/L (ref 3.5–5.1)
pCO2, Ven: 43.1 mmHg — ABNORMAL LOW (ref 45.0–50.0)
pO2, Ven: 39 mmHg (ref 30.0–45.0)

## 2010-10-17 LAB — HEMOGLOBIN AND HEMATOCRIT, BLOOD
HCT: 25.2 % — ABNORMAL LOW (ref 36.0–46.0)
HCT: 26.6 % — ABNORMAL LOW (ref 36.0–46.0)
Hemoglobin: 8.7 g/dL — ABNORMAL LOW (ref 12.0–15.0)

## 2010-10-17 LAB — CLOSTRIDIUM DIFFICILE EIA

## 2010-10-17 LAB — GLUCOSE, CAPILLARY
Glucose-Capillary: 104 mg/dL — ABNORMAL HIGH (ref 70–99)
Glucose-Capillary: 115 mg/dL — ABNORMAL HIGH (ref 70–99)

## 2010-10-17 LAB — URINE MICROSCOPIC-ADD ON

## 2010-10-25 LAB — CBC
Platelets: 233 10*3/uL (ref 150–400)
RDW: 13.4 % (ref 11.5–15.5)
WBC: 7 10*3/uL (ref 4.0–10.5)

## 2010-10-25 LAB — COMPREHENSIVE METABOLIC PANEL
ALT: 10 U/L (ref 0–35)
AST: 16 U/L (ref 0–37)
Albumin: 3.6 g/dL (ref 3.5–5.2)
Alkaline Phosphatase: 87 U/L (ref 39–117)
BUN: 12 mg/dL (ref 6–23)
Chloride: 102 mEq/L (ref 96–112)
GFR calc Af Amer: >60 mL/min (ref >60)
Potassium: 4.2 mEq/L (ref 3.5–5.1)
Sodium: 136 mEq/L (ref 135–145)
Total Protein: 7.2 g/dL (ref 6.0–8.3)

## 2010-10-25 LAB — DIFFERENTIAL
Basophils Relative: 0 % (ref 0–1)
Eosinophils Relative: 3 % (ref 0–5)
Monocytes Absolute: 0.3 10*3/uL (ref 0.1–1.0)
Monocytes Relative: 4 % (ref 3–12)
Neutro Abs: 4.9 10*3/uL (ref 1.7–7.7)

## 2010-11-23 NOTE — Assessment & Plan Note (Signed)
OFFICE VISIT   [name]  DOB:  [birth year]                                       10/31/2007  ZOXWR#:[id number]   This patient is a 77 year old female whom I have previously seen for  bilateral common iliac artery stents in July 2007.  She has done well  from this but states that she does have some burning in her calves  occasionally.  Unfortunately, she continues to smoke.  She was sent for  evaluation today for recent carotid duplex exam showing a high-grade  stenosis of the left internal carotid artery which is asymptomatic.  She  has a preexisting history of a field cut on the right side.  It is an  upper field cut and has been present since 2002.  She denies any other  history of TIA, amaurosis, or stroke.   MEDICATIONS:  Currently she is on aspirin alone.  She states that she  was between doctors and several of her medications were not refilled.   PAST MEDICAL HISTORY:  Remarkable for hypertension, elevated  cholesterol.   REVIEW OF SYSTEMS:  She has some occasional chest pain while sitting  still.  She does not necessarily describe chest pain with activity.  She  describes no dyspnea.  Review of systems is otherwise unremarkable for HEENT, GI, renal,  pulmonary, cardiac, musculoskeletal, neurologic, or vascular system.  MUSCULOSKELETAL:  She does have some chronic back pains and occasional  left leg pain.   PHYSICAL EXAM:  Blood pressure is 191/86 in the left arm, pulse is 80  and regular.  HEENT is remarkable for dentures.  Neck:  She has no  bruit.  Chest is clear to auscultation.  Cardiac exam is regular rate  and rhythm.  Abdomen:  Soft, nontender with no masses.  Extremities:  She has 2+ femoral pulse, 1+ posterior tibial pulse.  Posterior tibial  pulse on the right side is absent.  Her feet are pink and warm.  Neurologic exam shows symmetric upper extremity and lower extremity  motor strength which is 5/5.   I reviewed her carotid  duplex exam today which shows a high-grade,  greater than 80% stenosis of the left internal carotid artery from a  duplex scan performed on April 21st.  She has less than 40% stenosis on  the right side.  She has antegrade bilateral vertebral flow.  Peak  systolic velocity on the left side is 458 cm/sec.   I believe the patient would benefit from left carotid endarterectomy for  stroke prophylaxis.  Today I discussed with her the risk, benefits,  possible complications and procedure details of carotid endarterectomy.  These included but not limited to risk of stroke 1-2%, cranial nerve  injury approximately 5%.   Additionally, since she does have some occasional chest pain and has had  vascular disease in multiple beds, I believe she warrants a cardiology  evaluation prior carotid endarterectomy.  We will schedule her for an  evaluation by Dr. Caprice Kluver with [facility] for  this.  Additionally, I have started her on atenolol 25 mg once a day  today for blood pressure control.  She will follow up with Dr. Wylene Simmer  on May 12 for further evaluation of this and for her other primary care  needs.  Her left carotid endarterectomy is scheduled for Nov 15, 2007  pending the cardiology evaluation.   Allison Hora. Fields, MD  Electronically Signed   CEF/MEDQ  D:  10/31/2007  T:  11/01/2007  Job:  [id number]   cc:   Gaspar Garbe, M.D.

## 2010-11-23 NOTE — Consult Note (Signed)
NAMESHELISHA, [name]                 ACCOUNT NO.:  [id number]   MEDICAL RECORD NO.:  [id number]          PATIENT TYPE:  INP   LOCATION:  3714                         FACILITY:  [facility]   PHYSICIAN:  James L. Malon Kindle., M.D.DATE OF BIRTH:  08/03/1930   DATE OF CONSULTATION:  02/05/2009  DATE OF DISCHARGE:                                 CONSULTATION   REASON FOR CONSULTATION:  Diarrhea.   REFERRED BY:  Thereasa Solo. Little, MD   PRIMARY GASTROENTEROLOGIST:  None.   The patient is unassigned   HISTORY:  A 77 year old woman who was admitted on January 14, 2009, with  chest pain and underwent coronary catheterization showing multivessel  vessel coronary disease.  She underwent a CT biopsy of a lung lesion  that proved to be CT-guided biopsy nonmalignant with a path showing  fibrotic stroma.  It was felt that her heart disease could be managed  best with stents and she came back in on the 16th and underwent  angioplasty and stenting with several stents placed in multiple  arteries.  She unfortunately developed a hematoma and had a  retroperitoneal bleed requiring 6 units of blood.  She underwent  exploration of the right groin and repair of the right iliac artery in  pain.  The patient has had persistent diarrhea since then with  uncontrollable stools.  She has had several accidents in bed.  Her other  GI issue has been that her liver test have been elevated, but slowly has  been improving.  We are asked to see her regarding her diarrhea and  liver test.  The patient denies any chronic diarrhea prior to this  admission.  She is not sure was she has had blood in her stool or not.  Her hemoglobin has dropped from 9.3 to 8.6.  Her total bilirubin was up  to 7, but it slowly dropped back down to 5 with both direct and indirect  and elevated.  Other liver tests were basically normal.  Due to her  incontinence of persistent diarrhea, we were asked to see her.   CURRENT MEDICATIONS:  Simvastatin,  Imdur, Cozaar, aspirin, Lopressor,  Lasix, Plavix, Lexapro, Protonix, magnesium oxide, psyllium, Tylenol,  nitroglycerin, Xanax, Atrovent, Xopenex, Percocet, morphine, Zofran.   The patient is allergic to CIPRO, LIPITOR, SULFUR, CEPHALOSPORINS,  VALIUM.   MEDICAL HISTORY:  Significant for,  1. Coronary disease status post multiple stenting as noted.  2. Lung mass with biopsy negative for malignancy as noted above.  3. COPD.  4. CVA.  5. Status post carotid endarterectomy, colectomy for diverticulitis,      appendectomy, cerebral aneurysm repair, and lumbar fusion.  6. History of craniotomy for cerebral aneurysm.   FAMILY HISTORY:  Noncontributory.   SOCIAL HISTORY:  She is still smoking.  Lives here in [place].   PHYSICAL EXAMINATION:  VITAL SIGNS:  Temperature 98, blood pressure  148/58.  GENERAL:  Somewhat anxious, quiet female in no acute distress.  LUNGS:  Grossly clear.  HEART:  Regular rate and rhythm without murmurs or gallops.  ABDOMEN:  Nondistended, soft with mild nonlocalizing  tenderness.   ASSESSMENT:  1. Diarrhea and incontinence could be due to Clostridium difficile or      possibly ischemia.  She is not having any bleeding.  I think a      sigmoidoscopy will be appropriate.  She probably should be able to      stand this without any sedation.  2. Hyperbilirubinemia probably due to absorption of large      retroperitoneal hematoma.  This appears to be resolving after      bleeding has been adequately controlled.  3. Status post retroperitoneal hematoma with repair of iliac vessels      by Dr. Edilia Bo.  4. Coronary artery disease as noted above.   PLAN:  I will go ahead with an unprepped and unsedated sigmoidoscopy in  the morning to evaluate for C. Diff as well as obtain stool culture and  possible biopsies.           ______________________________  Llana Aliment Malon Kindle., M.D.     Waldron Session  D:  02/05/2009  T:  02/06/2009  Job:  [id number]   cc:   Thereasa Solo. Little, M.D.

## 2010-11-23 NOTE — Procedures (Signed)
CAROTID DUPLEX EXAM   INDICATION:  Follow-up evaluation of known carotid artery disease.  Patient reports pain and swelling in the left neck, which occurred last  night.   HISTORY:  Diabetes:  No.  Cardiac:  No.  Hypertension:  Yes.  Smoking:  Yes.  Previous Surgery:  Left carotid endarterectomy with Dacron patch  angioplasty on 11/15/07 by Dr. Darrick Penna.  CV History:  Previous (preoperative) carotid duplex performed on  10/30/07 revealed a 1-39% right ICA stenosis and an 80-99% left ICA  stenosis.  Amaurosis Fugax No, Paresthesias No, Hemiparesis No                                       RIGHT             LEFT  Brachial systolic pressure:  Brachial Doppler waveforms:  Vertebral direction of flow:  DUPLEX VELOCITIES (cm/sec)  CCA peak systolic                                     76  ECA peak systolic                                     45  ICA peak systolic                                     36  ICA end diastolic                                     6  PLAQUE MORPHOLOGY:                                    None  PLAQUE AMOUNT:                                        None  PLAQUE LOCATION:                                      None   IMPRESSION:  1. No left internal carotid artery stenosis, status post      endarterectomy.  2. 0.96 X 2.4 cm anechoic structure seen adjacent to the Dacron patch.      No active flow is identified within the structure.   ___________________________________________  Quita Skye Hart [place], M.D.   MC/MEDQ  D:  11/20/2007  T:  11/20/2007  Job:  [id number]

## 2010-11-23 NOTE — Assessment & Plan Note (Signed)
OFFICE VISIT   [name], [name]  DOB:  10-24-1930                                       11/20/2007  NFAOZ#:[id number]   This is an office visit.  The patient underwent left carotid  endarterectomy by Dr. Darrick Penna on 05/07 for severe but asymptomatic left  internal carotid stenosis.  She was discharged the following day.  She  returns today with swelling in her left neck which she says has  increased some over the last 3 days.  She has had no trouble swallowing  and has had no hoarseness or neurologic deficits or hemispheric TIAs.  She takes aspirin on a daily basis but no other anticoagulants.   PHYSICAL EXAMINATION:  Vital signs:  On exam blood pressure is 174/76,  heart rate 57, respirations are 18.  Neck:  Her left neck incision is  healing adequately.  There is a small to moderate sized diffuse swelling  in the left neck beneath the incision.  There is no pulsatile or  expansile mass noted.  Neurologic:  Exam is normal.  Carotid pulses are  3+ with no audible bruits.   Carotid duplex exam revealed no evidence of any pseudoaneurysm or  problems with the carotid endarterectomy site.  She does have a hematoma  in the operative area.  She was reassured regarding these findings and  will treat this with heat and was given a prescription for Darvocet-N  100 tablets #30.  She will return within the next 2 weeks for further  followup by Dr. Darrick Penna unless she has any worsening of her symptoms.   Quita Skye Hart [place], M.D.  Electronically Signed   JDL/MEDQ  D:  11/20/2007  T:  11/21/2007  Job:  [id number]

## 2010-11-23 NOTE — Consult Note (Signed)
[name], CERMAK                 ACCOUNT NO.:  [id number]   MEDICAL RECORD NO.:  [id number]          PATIENT TYPE:  OUT   LOCATION:  XRAY                         FACILITY:  [facility]   PHYSICIAN:  Di Kindle. Edilia Bo, M.D.DATE OF BIRTH:  Sep 21, 1930   DATE OF CONSULTATION:  01/25/2009  DATE OF DISCHARGE:  01/19/2009                                 CONSULTATION   REFERRING PHYSICIAN:  Ritta Slot, MD   REASON FOR CONSULTATION:  Bleeding from the right femoral catheter site  with retroperitoneal hematoma.   HISTORY:  This is a 77 year old woman who was admitted with chest pain  and underwent cardiac catheterization which showed multivessel coronary  disease.  She was also found to have a lung malignancy and after  extensive discussion it was felt that the coronary disease would best be  addressed from an endovascular standpoint and she underwent percutaneous  coronary intervention via right femoral approach on January 23, 2009.  She  had a good result from a cardiac standpoint.  After the procedure that  night, she did have a drop in her blood pressure to 69 systolic.  This  was after the right femoral sheath was removed.  She subsequently had a  CT scan which showed a retroperitoneal hematoma.  Over the last 2 days,  she has required 6 units of packed red blood cells.  Today, she has had  persistent pain, now with nausea and vomiting, and CT scan was obtained  which showed an enlarging retroperitoneal hematoma.  Vascular Surgery  was consulted for further recommendations.   PAST MEDICAL HISTORY:  1. Coronary artery disease.  2. Peripheral vascular disease.  3. Hypertension.  4. History of craniotomy for a cerebral aneurysm in 1992.  5. History of hyperlipidemia.  6. History of chronic bronchitis.  7. History of COPD.  8. History of a recently diagnosed lung cancer.   SOCIAL HISTORY:  She lives in [place].  She smokes a pack per day of  cigarettes.   FAMILY HISTORY:   Significant for cancer and hypertension.  She is  unaware of any history of premature cardiovascular disease.   MEDICATIONS:  Documented on her MedRec form.   REVIEW OF SYSTEMS:  Currently, she complains of abdominal pain and  nausea and vomiting.  She has no current chest pain, chest pressure, or  palpitations.  She has no significant shortness of breath.  Review of  systems otherwise unremarkable.   PHYSICAL EXAMINATION:  GENERAL:  This is a pleasant 77 year old woman  who appears her stated age.  VITAL SIGNS:  Temperature is 97.8, heart rate 114, and blood pressure  128/51.  LUNGS:  Clear bilaterally to auscultation.  CARDIAC:  She has a regular rate and rhythm.  ABDOMEN:  Soft but slightly distended and tender to palpation.  Her  right groin is tender.  I cannot palpate pedal pulses, although both  feet appear adequately perfused.   ASSESSMENT AND PLAN:  I have reviewed her CT scan which shows an  enlarging retroperitoneal hematoma.  Given the enlargement of the  retroperitoneal hematoma on CT scan and the  need for 6 units of packed  red blood cells over the last 2 days with increasing pain, I think she  could potentially have evidence of active bleeding.  For this reason, I  have recommended emergent exploration in the operating room to explore  the artery and repair the artery.  I have explained that it may be  possible to perform exploratory laparotomy in order to get proximal  control if needed, although I will attempt to explore the artery to the  right groin.  We have discussed the indications for surgery and the  potential complications including but not limited to bleeding and wound  healing problems.  The family and the patient are agreeable to proceed  urgently.      Di Kindle. Edilia Bo, M.D.  Electronically Signed     CSD/MEDQ  D:  01/25/2009  T:  01/26/2009  Job:  [id number]

## 2010-11-23 NOTE — Consult Note (Signed)
NAMESHAMAR, [name]                 ACCOUNT NO.:  [id number]   MEDICAL RECORD NO.:  [id number]          PATIENT TYPE:  INP   LOCATION:  3315                         FACILITY:  [facility]   PHYSICIAN:  Allison Helling, MD        DATE OF BIRTH:  1931-03-29   DATE OF CONSULTATION:  01/15/2009  DATE OF DISCHARGE:                                 CONSULTATION   REASON FOR CONSULTATION:  Pulmonary clearance.   CONSULTING PHYSICIAN:  Allison Guadalajara, MD   HISTORY OF PRESENT ILLNESS:  This is a 77 year old white female who  presented on January 14, 2009, with a 3-day history of on and off chest  pain.  She noted that chest pain did radiate to the left arm with  intermittent associated nausea and vomiting.  She presented to the  emergency room with these symptoms.  She demonstrated mild troponin I  elevation, but no significant ST changes in the emergency room.  She was  admitted for further evaluation, underwent cardiac catheterization today  on January 15, 2009, which demonstrated severe 3-vessel disease including  severely diseased circ, a severely diseased left anterior descending and  right coronary artery disease as well.  Essentially, will require  coronary artery bypass grafting for treatment of her obstructive  disease.  Pulmonary was asked to see the patient for pulmonary  clearance.   PAST MEDICAL HISTORY:  1. Prior coronary artery disease.  2. Severe peripheral vascular disease.  3. Hypertension.  4. Prior craniotomy for cerebral aneurysm in 1992.  5. Hyperlipidemia.  6. Chronic bronchitis,  7. Question of obstructive pulmonary disease.  8. Degenerative joint disease.  9. Bilateral cataract surgery.  10.Left carotid endarterectomy for severely diseased carotid artery.  11.Bilateral iliac stents.   She has also had appendectomy, hysterectomy, partial colectomy, and  hemorrhoidectomies.   SOCIAL HISTORY:  She is an active smoker.  She lives here in [place].  She is retired from [employer].  She is an active smoker, still  smokes up to 1 pack per day.   FAMILY HISTORY:  Positive for cancer and hypertension.   HOME MEDICATIONS:  1. Lisinopril 20 daily.  2. Atenolol 20 mg p.o. b.i.d.  3. Zocor daily.  4. Aspirin 325 daily.   REVIEW OF SYSTEMS:  GENERAL:  Negative for fever, chills, sweats, or  weight gains.  HEENT:  Negative for headache, nasal discharge, voice  change, vertigo, photophobia, visual loss, or dental changes.  SKIN:  Within normal limits.  CARDIAC:  Currently denies chest discomfort and  all other cardiopulmonary symptoms are at baseline and unchanged from  history of present illness.  GU:  Negative.  GI:  Denies current nausea,  vomiting.  Denies excessive thirst, heat, or cold intolerance.   PHYSICAL EXAMINATION:  VITAL SIGNS:  Blood pressure 136/82, heart rate  80, respirations 16, and saturation 98% on 2 liters nasal cannula.  GENERAL:  This is a pleasant, alert and oriented female.  NEUROLOGIC:  Cranial nerves II through XII are grossly intact.  Good  strength bilaterally.  HEENT:  Pupils are equal and reactive  to light.  Extraocular movements  are intact.  Sclera is clear.  NECK:  Supple without adenopathy.  CARDIAC:  Regular rate and rhythm.  PULMONARY:  Clear, nonlabored and without accessory muscle use.  ABDOMEN:  Soft, nontender.  GU:  Unremarkable.  MUSCULOSKELETAL:  Unremarkable.  SKIN:  Intact.  She does have a right groin dressing from recent cardiac  catheterization.   Chest x-ray demonstrates essentially clear film with prominent  interstitial markings.   LABORATORY DATA:  Troponin I 1.17, relative index  10, and CK-MB 10.6.  TSH 2.221.  White blood cell count 7.5, hemoglobin 12.8, hematocrit  37.7, and platelet count 209.  BNP 149.   IMPRESSION AND PLAN:  This is a 77 year old female patient with a  history of tobacco abuse and chronic bronchitis, admitted with a non-ST  elevation myocardial infarction.  She had a cardiac  catheterization that  shows severe multivessel coronary artery disease and will need coronary  artery bypass graft.  At this point, recommendations will be to adjust  nebulizers, arrange for pulmonary function testing and assess for  airflow obstruction.  Depending on urgency for coronary artery  intervention, would like to review response to bronchodilators and  review pulmonary function tests.  If intervention is felt to be urgent,  then can go ahead and proceed with procedure.      Allison Resides, NP      Allison Helling, MD  Electronically Signed    PB/MEDQ  D:  01/15/2009  T:  01/16/2009  Job:  [id number]

## 2010-11-23 NOTE — Consult Note (Signed)
NAMEKATELYNN, HEIDLER                 ACCOUNT NO.:  [id number]   MEDICAL RECORD NO.:  [id number]          PATIENT TYPE:  OUT   LOCATION:  XRAY                         FACILITY:  [facility]   PHYSICIAN:  Antonietta Breach, M.D.  DATE OF BIRTH:  1931/01/19   DATE OF CONSULTATION:  02/06/2009  DATE OF DISCHARGE:  01/19/2009                                 CONSULTATION   REASON FOR CONSULTATION:  Depression.   HISTORY OF PRESENT ILLNESS:  Shanekqua Schaper is a 77 year old female  admitted to the [facility] on January 14, 2009 due to left arm pain  and an MI.   She has been suffering from a number of general medical problems.  Please see her past medical history.   She has just been diagnosed with lung cancer.  She has over 10 days of  worsening depressed mood.  She also has malaise, low energy, difficulty  concentrating, and insomnia.  Her mood is depressed.  She does not have  any thoughts of harming herself or others.  She has no hallucinations or  delusions.  She does have intact orientation as well as memory function.   Her worsening mood symptoms are correlated with her general medical  problems.   PAST PSYCHIATRIC HISTORY:  She does have a history of major depression  and was on Zoloft.  She does not recall any efficacy of the Zoloft.   FAMILY PSYCHIATRIC HISTORY:  None known.   SOCIAL HISTORY:  She does have a daughter in the area who looks in on  her.  Marital status:  Widowed.  Occupation:  Retired.  Religion:  [employer].  She does not use any alcohol or illegal drugs.   PAST MEDICAL HISTORY:  1. Coronary artery disease.  2. Lung cancer.   ALLERGIES:  1. LIPITOR.  2. CIPRO.  3. CEPHALOSPORINS.  4. SULFA.  5. VALIUM.   MEDICATIONS:  Her MAR is reviewed.  1. She is on Lexapro just prescribed this past week at 10 mg daily.  2. She also is on Xanax 0.25 mg b.i.d. p.r.n.   LABORATORY DATA:  Sodium 135, BUN 17, creatinine 1.11.  WBC 8.2,  hemoglobin 8.5, platelet count 378.  TSH normal on July 7.   REVIEW OF SYSTEMS:  Constitutional, head, eyes, ears, nose, throat,  mouth neurologic, psychiatric cardiovascular, respiratory unremarkable.  GASTROINTESTINAL:  She has been having regular diarrhea.  Genitourinary,  skin, musculoskeletal, hematologic lymphatic, endocrine, metabolic  unremarkable.   PHYSICAL EXAMINATION:  VITAL SIGNS:  Temperature 97.5, pulse 79,  respiratory rate 18, blood pressure 150/63, O2 saturation room air 91%.  GENERAL APPEARANCE:  Mrs. Owensby is an elderly female lying in a supine  position in her hospital bed with no abnormal involuntary movements.   MENTAL STATUS EXAM:  Mrs. Sandeen is alert.  Her attention span is slightly  decreased.  Her eye contact is good.  Her affect is constricted.  Her  mood is depressed.  Her concentration is mildly decreased.  She is  oriented to all spheres.  Her memory is intact to immediate, recent, and  remote.  Her  fund of knowledge and intelligence are within normal  limits.  Her speech is soft with normal rate.  Her prosody is mildly  flat.  There is no dysarthria.  Thought process logical, coherent, goal-  directed.  No looseness of associations.  Thought content:  No thoughts  of harming herself or others.  No delusions or hallucinations.  Her  insight is intact.  Her judgment is intact.   ASSESSMENT:  AXIS I:  1. 293.83.  Mood disorder, not otherwise specified (idiopathic and      general medical factors), depressed.  2. Major depressive disorder, recurrent, severe.  AXIS II:  Deferred  AXIS III:  See past medical history.  AXIS IV:  General medical.  AXIS V:  55.   Mrs. Abruzzese is not at risk to harm herself or others.  She agrees to call  emergency services immediately for any thoughts of harming herself,  thoughts of harming others, or distress.   The undersigned provided ego supportive psychotherapy and education.  The indications, alternatives and adverse effects of adding Remeron to  Lexapro  were discussed.  The patient understands and wants to add  Remeron.   DISCUSSION:  Adding Remeron at this point will help block nausea and  also reduce the risk of Lexapro exacerbating her diarrhea (Remeron  actually has a slight risk of constipation).   Remeron has anti 5-HT III effects for nausea.  It can stimulat her  appetite and can help with anti insomnia.   Also it will reduce her need acutely for Xanax, Remeron does tend to  have anti acute anxiety benefits.   Over the long-term Remeron can help with energy through alpha II  blockade.   Would start Remeron at 7.5 mg q. 1800 and then increase by 7.5 mg per  day as needed to 22.5 mg at bedtime.Marland Kitchen   No change in Lexapro.   Outpatient psychiatric follow-up can be obtained at one of the clinics  attached to [facility], [place], or [facility].      Antonietta Breach, M.D.  Electronically Signed     JW/MEDQ  D:  02/08/2009  T:  02/08/2009  Job:  [id number]

## 2010-11-23 NOTE — Assessment & Plan Note (Signed)
OFFICE VISIT   [name], [name]  DOB:  [birth year]                                       03/05/2009  ZOXWR#:[id number]   I saw the patient in the office today for followup.  She underwent  exploration of the right groin and repair of the right external iliac  artery and right external iliac artery vein on 01/25/2009.  She had  developed a retroperitoneal hematoma after cardiac catheterization.  She  had required 6 units of blood and was having increasing pain in her  groin and had an enlarging retroperitoneal hematoma.  She returns for  her first outpatient visit.   Overall she looks like she is regaining her strength.  She has no  specific complaints.  Blood pressure is 190/78, heart rate is 68.  Her  right groin incision has healed nicely.  Her foot is warm and well-  perfused.  Overall I think her incision has healed adequately and I will  simply see her back p.r.n.   Di Kindle. Edilia Bo, M.D.  Electronically Signed   CSD/MEDQ  D:  03/05/2009  T:  03/06/2009  Job:  [id number]

## 2010-11-23 NOTE — Procedures (Signed)
CAROTID DUPLEX EXAM   INDICATION:  Followup known carotid artery disease.   HISTORY:  Diabetes:  No.  Cardiac:  No.  Hypertension:  Yes.  Smoking:  Yes.  Previous Surgery:  Intracranial aneurysm repair in 1993.  CV History:  TIA in 2000.  Amaurosis Fugax No, Paresthesias No, Hemiparesis No                                       RIGHT             LEFT  Brachial systolic pressure:         180               180  Brachial Doppler waveforms:         Biphasic          Biphasic  Vertebral direction of flow:        Antegrade         Antegrade  DUPLEX VELOCITIES (cm/sec)  CCA peak systolic                   86                74  ECA peak systolic                   110               346  ICA peak systolic                   115               458  ICA end diastolic                   30                117  PLAQUE MORPHOLOGY:                  Heterogenous      Calcified  PLAQUE AMOUNT:                      Mild              Severe  PLAQUE LOCATION:                    ICA, ECA          ICA, ECA   IMPRESSION:  1. 80-99% stenosis noted in the left internal carotid artery.  2. 1-39% stenosis noted in the right internal carotid artery.  3. Antegrade bilateral vertebral arteries.   ___________________________________________  Janetta Hora Fields, MD   MG/MEDQ  D:  10/30/2007  T:  10/30/2007  Job:  [id number]

## 2010-11-23 NOTE — Op Note (Signed)
NAMECLEMENCIA, [name]                 ACCOUNT NO.:  [id number]   MEDICAL RECORD NO.:  [id number]           PATIENT TYPE:   LOCATION:                                 FACILITY:   PHYSICIAN:  Di Kindle. Edilia Bo, M.D.DATE OF BIRTH:  01/31/1931   DATE OF PROCEDURE:  01/25/2009  DATE OF DISCHARGE:                               OPERATIVE REPORT   PREOPERATIVE DIAGNOSIS:  Retroperitoneal hematoma, status post cardiac  catheterization.   POSTOPERATIVE DIAGNOSIS:  Retroperitoneal hematoma, status post cardiac  catheterization.   PROCEDURE:  Exploration of right groin and repair of right external  iliac artery and right external iliac vein.   SURGEON:  Di Kindle. Edilia Bo, MD   ASSISTANT:  Wilmon Arms, PA.   ANESTHESIA:  General.   INDICATIONS:  This is a 77 year old woman, status post percutaneous  coronary intervention on January 23, 2009.  She developed a retroperitoneal  hematoma and over the last 2 days, has required 6 units of packed red  blood cells.  She has had increasing pain in the groin and lower abdomen  and CT scan shows an enlarging retroperitoneal hematoma.  Vascular  Surgery was consulted and I felt an urgent exploration was recommended  in order to rule out any active bleeding and to help control pain.   TECHNIQUE:  The patient was taken to the operating room and received a  general anesthetic.  The abdomen and both groins were prepped and draped  in usual sterile fashion.  A longitudinal incision was made in the right  groin and started to get distal control by controlling the superficial  femoral artery below the area of injury.  The superficial femoral artery  was controlled and the dissection continued proximally.  The deep  femoral artery was then identified and then the common femoral artery  identified.  The dissection was then continued up to the inguinal  ligament.  I had fully mobilized the inguinal ligament and continued  dissection well above the  inguinal ligament.  Upon exploration, the area  of injury was noted, and I was able to get control above this and then  clamped proximally and distally after the patient was heparinized in  order to repair the artery.  Once the artery was clamped, the scar  tissue and artery were dissected out and then dissecting out the artery,  it was clear that there was also a venous injury right adjacent to the  arterial injury.  The vein proximally and distally were dissected out  and clamped, and then the venous injury was repaired with a running 5-0  Prolene suture.  Next, attention was turned back to the artery and the  injury here was closed with 5-0 Prolene suture.  At completion, there  was good hemostasis.  The retroperitoneal hematoma was entered, and it  was difficult to irrigate out much of this hematoma, which was diffused  into tissue, but I was able to irrigate out some old blood, and a 19-  Blake drain was placed in this area.  Hemostasis was obtained in the  wound.  The heparin  was partially reversed with protamine.  The wound  was closed with deep layer of 2-0 Vicryl.  The subcutaneous layer was closed with 2-0 Vicryl.  The skin was closed  with a 4-0 subcuticular stitch.  A sterile dressing was applied.  The  patient tolerated the procedure well and was transferred to the recovery  room in satisfactory condition.  All needle and sponge counts were  correct.      Di Kindle. Edilia Bo, M.D.  Electronically Signed     CSD/MEDQ  D:  01/25/2009  T:  01/26/2009  Job:  [id number]

## 2010-11-23 NOTE — Discharge Summary (Signed)
[name], [name]                 ACCOUNT NO.:  [id number]   MEDICAL RECORD NO.:  [id number]          PATIENT TYPE:  INP   LOCATION:  3714                         FACILITY:  [facility]   PHYSICIAN:  Nicki Guadalajara, M.D.     DATE OF BIRTH:  08/02/1930   DATE OF ADMISSION:  01/14/2009  DATE OF DISCHARGE:  02/12/2009                               DISCHARGE SUMMARY   DISCHARGE DIAGNOSIS:  1. Non-ST elevation myocardial infarction.  2. Significant coronary artery disease.      a.     Evaluated by cardiovascular surgeon and turned down for       bypass grafting at this time.      b.     Percutaneous transluminal coronary angioplasty stent to the       posterior descending artery of circumflex, percutaneous       transluminal coronary angioplasty stent to the obtuse marginal of       circumflex, percutaneous transluminal coronary angioplasty stent       to the mid circumflex and percutaneous transluminal coronary       angioplasty and stent to the mid distal segment of the left       circumflex vessel with non drug-eluting  Vision stent.  3. Moderate left ventricular hypertrophy with hyperdynamic left      ventricular function ejection fraction of  70%.  4. Acute blood loss anemia secondary to the next number.  5. Acute retroperitoneal bleed.  6. Cardiovascular shock secondary to acute blood loss due to      retroperitoneal bleed, resolved.  7. Ileus/ small bowel obstruction, resolved.  8. Jaundice, resolving.      a.     Elevated bilirubin secondary to reabsorption of  peritoneal       bleed.  9. Lung mass with positive PET scan but  negative needle biopsy,      awaiting further testing.  10.Acute diarrhea resolved,  negative Clostridium difficile  and      negative sigmoidoscopy  11.Depression, situational treated with medication.  12.Urinary tract infection empirically treated currently.  13.History of tobacco abuse, states she is stopping.  14.Small infrarenal abdominal aortic  aneurysm.  15.Peripheral vascular disease with history of iliac stents as well as      carotid endarterectomy.  16.History of noncompliance with medications.  17.Acute renal insufficiency secondary to bleed and dehydration,      resolving at discharge.   DISCHARGE CONDITION:  Stable, slowly improving.   PROCEDURES:  1. January 15, 2009 combined left heart catheterization by Dr. Julieanne Manson.  2. January 23, 2009 PCI and stents to PDA of circumflex, OM of      circumflex, midcircumflex and mid distal of the circumflex by Dr.      Yates Decamp.  3. January 25, 2009 exploration of the right groin and repair of right      external iliac artery and right external iliac vein secondary to      retroperitoneal hematoma by Dr. Cari Caraway.  4. February 06, 2009 sigmoidoscopy and biopsy by Dr. Fayrene Fearing  Edwards.   DISCHARGE MEDICATIONS:  1. Aspirin 325 mg one daily.  2. Nitroglycerin 1/150 under tongue as needed for chest pain, one      every 5 minutes up to three over 15 minutes.  3. Macrobid 100 mg one twice a day for 7 days for UTI.  4. Protonix 40 mg one twice a day.  5. Lexapro 10 mg once daily.  6. Lopressor 25 mg one twice a day.  7. Cozaar 50 mg one daily.  8. Plavix 75 mg daily.  9. Remeron 7.5 mg one daily.  10.Xanax 0.25 mg twice a day as needed.  11.Tylenol 650 mg two every 4 hours as needed for discomfort.  12.Percocet 5/325 one every 3 hours as needed for severe pain.  13.Lomotil 1 tablet after each loose stool up to 8 tablets daily.  14.Ensure chocolate twice a day.  15.Stop lisinopril, atenolol and Zocor.   DISCHARGE INSTRUCTIONS:  1. Increase activity slowly.  No lifting for 4 weeks.  No driving for      4 weeks.  Continue physical therapy and occupational therapy at      skilled nursing facility.  2. Low-sodium heart-healthy diet.  3. Apply dressing to JP site right thigh as needed but daily at least      to change clean site with Betadine with dressing change.  4. Stop  smoking.  5. Follow up with Dr. Lynnea Ferrier at [facility] and Vascular      Tuesday, March 04, 1999 10:11 a.m.  6. Follow up with Dr. Cari Caraway, the Vascular Surgeon.  His office      will call with date and time.  7. Follow up with Dr.  Marchelle Gearing, with Pulmonary at [facility] on March 04, 2009 at 10:30 a.m.  8. Follow with Dr. Randa Evens who was gastroenterologist, if needed for      further GI complications.   HISTORY OF PRESENT ILLNESS:  The patient admitted July 7 with complaints  of chest pain.  She has not always been compliant with her medications.  The patient has a history of peripheral vascular disease with carotid  endarterectomy with CVA prior to the carotid endarterectomy.  She has  also had a cerebral aneurysm repair x2 in 1992, history of coronary  disease not documented.   The patient presented with chest pain January 14, 2009 on and off for 3 days  prior to admission but worse day was on the day of admission with left  chest pain through to her back down her left arm with nausea and  vomiting.  Denied shortness of breath or diaphoresis.  She felt at times  she would pass out with activity also tachyarrhythmias.  She was given  nitroglycerin in the emergency room, placed on nitro drip with relief of  pain.  She had positive troponins on admission 0.11 and 0.12.   The patient was admitted for further observation.   PAST MEDICAL HISTORY:  1. Hypertension.  2. Craniotomy with cerebral aneurysm repair x2.  3. Hyperlipidemia.  4. Chronic bronchitis/  COPD.  5. Degenerative disk disease of the spine.  6. Bilateral cataracts.  Now she only sees down.  7. She had carotid endarterectomy on Nov 15, 2007.  8. Bilateral iliac stents in July of 2007.  9. History of tobacco use, one pack per day for about 50 years.  10.She has had a hysterectomy, hemorrhoidectomy.  11.Partial colectomy in the past and poor vision.   DURING HOSPITALIZATION:  Cardiac status:  The patient  had non-ST-  elevation MI, was found to have significant coronary disease at  catheterization.  Please see Dr. Fredirick Maudlin dictated cath report.  We had  a consult with the cardiovascular surgeons who did not feel she was a  candidate at this time for bypass grafts as she has a limited overall  ability to care for herself.  From a  technical standpoint she could  undergo bypass but because of poor functional status at this point, she  was turned down for bypass grafting.   Once Pulmonary cleared the patient she underwent intervention by Dr. Yates Decamp as stated in the procedures.  She tolerated the procedure well.  Unfortunately, later that evening after the sheath was pulled, she had  severe right groin pain as well as a drop in blood pressure.  She became  in shock responding initially to fluid.  CT of her groin was done  emergently and she was found to have a hematoma in her thigh as well as  retroperitoneal.  IV fluids were given.  She was transfused blood, 6  units of packed cells over the course of  3 days as well as fresh frozen  plasma.  She was transferred to the Intensive Care Unit,  continued IV  fluids.  She developed elevated BUN and creatinine, renal insufficiency  acutely.   The patient continued with IV fluids.  Unfortunately she rebled into  that groin and retroperitoneal bleed within 24 hours. On July 18 a  Vascular surgeon consult with Dr. Cari Caraway was obtained and she  went to the OR with exploration of the right groin and repair of the  right external iliac artery and right external iliac vein.   With these complications, she was continued in the Intensive Care Unit.  She slowly began improving.  Blood pressure stabilized.  She had been on  dopamine at one point.  With the blood and fluid resuscitation, her  blood pressure stabilized and once the bleeding had been stopped, she  continued to improve.  At discharge, she has had no further chest pain.  She is on  Plavix for her stents as well as aspirin.  Her incision line  on the right side is stable.  She has been draining a large amount of  serous fluid with ambulation especially dressing change has been done  and it just needs to be monitored; hopefully it will seal  off  completely. It is not from the actual incision but from where the JP  drain was more distal to the incision.   Pulmonary:  In the process of preparing the patient for possible bypass  grafting that again she was turned down for on further evaluation of her  lungs, she underwent pulmonary consult as she had a left chest mass.  In  evaluation, CT of her chest was done and she was found to have an 8  x  12 mm nodule in the superior segment of the left lower lobe which may  represent carcinoma.  She had chronic lung disease with subpleural blebs  bilaterally but no acute infiltrate.  CAT scan was then done.  Pulmonary  nodule was in the superior segment of the left lower lobe and was  intensely hypermetabolic consistent with primary lung neoplasm,  hypermetabolic left hilar lymph nodes are worrisome for metastatic  disease and bilateral intermittent nodules within the adrenal glands  exhibit low level FDG uptake. This likely represents benign adenomas  when compared with CT of the abdomen dated July 2007.  These are  unchanged.  Due to significance of the PET scan the patient was  scheduled for a CT biopsy of the left lower lobe nodule.  Successful CT-  guided fine-needle aspirate and core biopsy of the left lower lobe lung  nodule; a tiny pneumothorax resulted. She has pathology results.  No  biopsies are composed of a new fragment of lung parenchyma with  intraalveolar hemorrhage.  They are nonspecific findings and may not be  representative to the lesion/ nodule in question.  Cytology came back:  Left lower lobe lung aspirate - fine-needle rare atypical cells and  fragments if fibrotic stroma with associated chronic  inflammation were  noted.  No diagnostic tumor cells seen, but again ensure it is  representative of a tumor.     Pulmonary reviewed these results, but felt that she needs an  open__________ procedure lung biopsy to assess this mass fully and she  would need to stop smoking as well.  She also will not have this other  testing currently. We will allow her to heal from her complications from  her cardiac catheterization and follow up with Dr.  Marchelle Gearing to follow  up for any further lung issues.   Ileus small bowel obstruction has resolved.  When she developed the  acute retroperitoneal bleed, she developed an ileus.  She had NG  initially for close to 1 week and then was able to eat and rink but then  developed more nausea and vomiting, possible small bowel obstruction was  noted.  NG was placed again for several more days and this was  eventually pulled after she was drinking liquids and she has maintained  her diet though poor intake since that time.   Diarrhea:  The patient developed acute episodes of diarrhea.  GI consult  was obtained and sigmoidoscopy was done which revealed benign colonic  mucosa and  no significant inflammation or other abnormalities  identified.   Jaundice:  The patient began being jaundiced.  She had elevated  bilirubin secondary to reabsorption of the blood in her retroperitoneal  and thigh  from her bleed.   Please note, total bilirubin has now returned to normal at 1.5.   Urinary tract infection will be treated with Macrobid as the patient is  allergic to Cipro, cephalosporins and sulfa.  Will monitor liver  functions with this.   Poor nutrition:  The patient had poor appetite during the  hospitalization. We have been giving her Ensure.  Nutrition consult was  done several times.   Depression, situational as well as family influence in that the  patient's family, all of the adults have lost jobs and are living with  the patient so she herself was  having to sleep on her own sofa so her  children could have rooms in her home , so she was very stressed prior  to hospitalization and now with multiple surgeries and complications,  she has continued to be depressed.  Dr. Jeanie Sewer saw her in consult and  added Remeron 7.5 mg  and it can be titrated as 7.5 mg increases daily  up to 22.5 mg at bedtime and this will help augment her Lexapro.  And  she has failed SSRI in  the past and also the Remeron can help with her  diarrhea problem.   Please note, Dr. Jeanie Sewer also recommended psychiatric follow-up as  needed to outpatient clinic.  The patient at this  time prefers to wait  and see how she does on the current medications.  We did not titrate up  the Remeron higher than 7.5 as she seemed somewhat improved although she  still not ambulating as frequently and complains about ambulation.   Please note, the patient had nondrug-eluting stents placed so an open  biopsy of her lung mass is needed.  She should be evaluated by  Cardiology prior to that biopsy.  She does have an appointment with Dr.  Lynnea Ferrier August 24 and she should remain on Plavix and aspirin until that  time.   LABORATORY DATA AT DISCHARGE:  Frequency  labs were discussed in her  hospital stay but at discharge,  UA was turbid, negative  ketones, small  bilirubin, large amount of blood, protein 30, urobilinogen 1,  nitrites  were positive and large amount of leukocytes, microscopic WBCs 21-50,  bacteria many and her culture and sensitivity are pending.   Comprehensive metabolic panel:  Sodium 137, potassium 3.7, chloride 102,  CO2 27, glucose 106, BUN 17 much improved. Creatinine  peak was 2.3.  ,  Creatinine at discharge 1.14 and GFR is 46, total bili down to 1.5,  had  been a peak I believe at 7.5.   Direct bilirubin of 3.3 and indirect bilirubin  2.3.  LFTs also were  elevated with SGOT of 46.   Fecal lactoferrin was positive.  C. diff.  was negative.   Hemoglobin  at discharge -  10.5, hematocrit 30, WBC 8.5 and platelets  381.   Stool culture was done.  No salmonella, no shigella, no campylobacter,  no ursinia  isolated   At times during hospitalization, magnesium was low, that was  replaced.  The patient also had some volume overload during hospitalization and was  given diuretic and this was directly related to the amount of volume received when we were resuscitating her with saline and blood.   Cardiac enzymes:  Initial cardiac markers-  troponin 0.12, MB 3.3,  myoglobin 77.4.  Follow-up 0.39, MB 6.2, myoglobin 107.   CK was 99, MB 10.7, troponin 1.13 and then 1.17 which was a peak  troponin   Glycohemoglobin 5.6.   TSH 2.221.   Lipid panel:  Total cholesterol 237, triglycerides 199, HDL 41, LDL 156.   The patient will need to resume her statin in the future but until her  GI issues are stabilized, we will hold off on resuming the Zocor.     Chest x-ray at time of discharge:  Mild residual interstitial edema,  improving basilar aeration.   The patient will be followed up as an outpatient.      Darcella Gasman. Annie Paras, N.P.    ______________________________  Nicki Guadalajara, M.D.    LRI/MEDQ  D:  02/11/2009  T:  02/11/2009  Job:  [id number]   cc:   Ritta Slot, MD  Sheliah Plane, MD  Critical Care Medicine  Kalman Shan, MD  Antonietta Breach, M.D.  Di Kindle. Edilia Bo, M.D.  Gabrielle Dare Janee Morn, M.D.  James L. Malon Kindle., M.D.

## 2010-11-23 NOTE — Cardiovascular Report (Signed)
NAMECATHARINE, [name]                 ACCOUNT NO.:  [id number]   MEDICAL RECORD NO.:  [id number]          PATIENT TYPE:  INP   LOCATION:  3315                         FACILITY:  [facility]   PHYSICIAN:  Thereasa Solo. Little, M.D. DATE OF BIRTH:  08/27/1930   DATE OF PROCEDURE:  01/15/2009  DATE OF DISCHARGE:                            CARDIAC CATHETERIZATION   INDICATION FOR TEST:  This 77 year old female is a heavy smoker.  She  has been noncompliant of her medications.  She has bilateral iliac  stents.  She has had carotid endarterectomy and has had a cerebral  aneurysm repair x2.   She was admitted on January 14, 2009, with chest pain.  Has a unremarkable  EKG but low positive CK and troponins.   After obtaining informed consent, the patient was prepped and draped in  the usual sterile fashion exposing the right groin.  Following local  anesthetic with 1% Xylocaine, the Seldinger technique was employed and a  5-French introducer sheath was placed in the right femoral artery.  Care  was taken to make sure that the J-wire easily passed through the stents.   Left and right coronary arteriography and ventriculography and a distal  aortogram was performed.   COMPLICATIONS:  None.   TOTAL CONTRAST:  100 mL.   EQUIPMENTS:  A 5-French Judkins configuration catheters; however, a Noto  catheter had to be used to engage the right coronary artery.   RESULTS:  1. Hemodynamic monitoring:  Central aortic pressure was 160/75.  Left      ventricular pressure was 164/8.  There was no gradient noted at the      time of pullback.  2. Ventriculography.  Ventriculography in the RAO projection using 25      mL of contrast at  12 mL per second revealed a hyperdynamic left      ventricle with an ejection fraction excess of 70%.  There was at      least moderate left ventricular hypertrophy.  Left ventricular end-      diastolic pressure was 15.   A distal aortogram done at the end of the procedure using 20 mL  of  contrast at 20 mL per second showed patent renal arteries, small  infrarenal abdominal aortic aneurysm, and bilateral proximal iliac  stents, which were patent.  They were under opacified.   Coronary arteriography:  On fluoroscopy, there was calcification noted  in the ascending aorta and calcification in the left main LAD.  1. Left main normal and bifurcated.  2. Circumflex.  The circumflex was a left dominant vessel.  In the mid      circumflex just after the first OM, there was an eccentric 60% area      of narrowing.  The ongoing distal circumflex proximal to the PDA      had an area of 90% plus narrowing.  The first OM had      proximal/ostial 80% narrowing and the mid OM had an area of 70%      narrowing.  3. LAD.  The LAD crossed the apex of the heart,  it was extremely      tortuous.  We took multiple views, but there is clearly a napkin      ring/shell-type lesion in the proximal LAD just distal to the first      diagonal that appeared to be approximately 80%.  The first diagonal      is free of disease.  4. Right coronary artery.  This is a nondominant vessel supplying the      RV wall.  It is 60% narrowed in its midportion.   CONCLUSION:  1. Hyperdynamic left ventricle.  2. Moderate LVH.  3. Small infrarenal abdominal aortic aneurysm.  4. Patent iliac stents.  5. Significant circumflex and LAD disease in a left dominant system.  6. History of cigarette abuse.  7. Hypertension with a history of noncompliance of her medications.   She needs bypass surgery.  I have already called [facility] to evaluate her.  I planned to restart the IV heparin in 4 hours and will also call  Pulmonary to evaluate the patient preoperatively.           ______________________________  Thereasa Solo Little, M.D.     ABL/MEDQ  D:  01/15/2009  T:  01/16/2009  Job:  [id number]   cc:   Cath Lab  Cristy Hilts. Jacinto Halim, MD

## 2010-11-23 NOTE — Consult Note (Signed)
[name], [name]                 ACCOUNT NO.:  [id number]   MEDICAL RECORD NO.:  [id number]          PATIENT TYPE:  INP   LOCATION:  3315                         FACILITY:  [facility]   PHYSICIAN:  Sheliah Plane, MD    DATE OF BIRTH:  [birth year]   DATE OF CONSULTATION:  01/15/2009  DATE OF DISCHARGE:                                 CONSULTATION   REQUESTING PHYSICIAN:  Thereasa Solo. Little, MD   FOLLOWUP CARDIOLOGIST:  Thereasa Solo. Little, MD   PRIMARY CARE PHYSICIAN:  Unknown.   REASON FOR CONSULTATION:  Coronary occlusive disease.   HISTORY OF PRESENT ILLNESS:  The patient is a 77 year old female with  multiple cardiac risk factors who presented with increasing episodes of  chest pain radiating to the left arm.  She denies diaphoresis or  shortness of breath.  She notes that the discomfort usually comes on in  the evening, not necessarily related to activity.  After urging from her  family after this persisted for several days, she came to the emergency  room.  Her troponin was 0.11 and 0.12.  She was admitted to the hospital  and underwent cardiac catheterization by Dr. Clarene Duke today.   Overall, the patient's functional status appears very limited.  Her  family notes that she spends most of the time sitting in the chair and  does little for herself and continues to smoke and has for more than 60  years.   She has had no known myocardial infarction or angioplasty in the past.  She has known hypertension and known hyperlipidemia.  Denies diabetes.  Has a positive family history for cardiac disease.  Two brothers have  had MIs and 1 sister, she has had a history of previous stroke, has  bilateral lower extremity claudication with stents in her iliac vessels.  She has no known renal insufficiency.   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   Previous history of stroke.   PAST SURGICAL HISTORY:  Left carotid endarterectomy, bilateral iliac  stents, hysterectomy, appendectomy, brain surgery x2 for cerebral  aneurysm, colon resection for diverticulitis, and lumbar fusion.   SOCIAL HISTORY:  The patient lives with her daughter and son-in-law.  Dr. Dorris Fetch has previously done coronary artery bypass on her son-in-  law.  As noted above, she has very limited overall ability to care for  herself.   MEDICATIONS:  Her chart lists medications, but in reality she came in  taking no medication and has not taken any since May when they ran out.   DRUG ALLERGIES:  ROCEPHIN, CIPRO, LIPITOR, SULFA, and VALIUM.   REVIEW OF SYSTEMS:  CARDIAC:  Positive for chest pain, palpitations, and  presyncope.  Denies orthopnea.  Does have exertional and resting  shortness of breath.  Denies syncope.  Does have lower extremity edema.  GENERAL:  The patient complains of significant weakness and fatigue,  chronic cough.  Denies hemoptysis.  History of diverticulitis and hiatal  hernia.  MUSCULOSKELETAL:  Overall, she is very weak and has limited  ability to get around.  Has a history of recurrent chronic bronchitis.  Does have claudication bilaterally.   PHYSICAL EXAMINATION:  VITAL SIGNS:  Her blood pressure 154, pulse is  88, respiratory rate is 18, and O2 sats 100% on 2 L.  GENERAL:  The patient is a very chronically ill-appearing female, looks  older than her 77 years.  HEENT:  She has full dentures.  NECK:  No carotid bruits.  No thyromegaly.  CARDIAC:  2/6 early systolic murmur.  ABDOMEN:  Without palpable tenderness.  Known small abdominal aorta  aneurysm, it is not palpable.  NEUROLOGIC:  She has decreased visual acuity, grossly worse in the left  eye than the right.  EXTREMITIES:  She has 1+ DP and PT pulses bilaterally.   LABORATORY DATA:  Cholesterol 236, triglycerides 199, HDL 41, and LDL  156.  Hematocrit is 38.  Creatinine 0.9.   Cardiac catheterization films  were reviewed.  She has 60% LAD lesion.  Circumflex has a 90% distal  lesion and 70% lesion in the first obtuse marginal and mid circumflex.  LAD is a very small nondominant vessel with a 50-60% lesion.   IMPRESSION:  The patient with myocardial injury by enzymes with coronary  occlusive disease and overall the patient with probable significant  pulmonary disease and very limited functional status, independent of her  recent onset of angina.   PLAN:  The patient's catheterization films are reviewed.  From a  technical standpoint, she could undergo coronary artery bypass.  However, currently on no medications and her overall very poor  functional status and at this point really unknown the degree of her  pulmonary disease, I would be reluctant to immediately proceed with  coronary artery bypass grafting.  We will obtain pulmonary consultation  before making final decision.  After seeing by Pulmonary and evaluated,  we will continue to evaluate her.  We would not consider her a candidate  for emergency bypass and after her evaluation by Pulmonary we will  review other options with her.      Sheliah Plane, MD  Electronically Signed     EG/MEDQ  D:  01/15/2009  T:  01/16/2009  Job:  [id number]   cc:   Thereasa Solo. Little, M.D.

## 2010-11-23 NOTE — Cardiovascular Report (Signed)
[name], [name]                 ACCOUNT NO.:  [id number]   MEDICAL RECORD NO.:  [id number]          PATIENT TYPE:  INP   LOCATION:  2313                         FACILITY:  [facility]   PHYSICIAN:  Cristy Hilts. Jacinto Halim, MD       DATE OF BIRTH:  06/26/1931   DATE OF PROCEDURE:  01/23/2009  DATE OF DISCHARGE:                            CARDIAC CATHETERIZATION   PROCEDURE PERFORMED:  1. Percutaneous transluminal coronary angioplasty and stenting of the      posterior descending artery branch of the dominant circumflex      coronary artery.  2. Percutaneous transluminal coronary angioplasty and stenting of the      obtuse marginal 1 branch of the circumflex coronary artery.  3. Percutaneous transluminal coronary angioplasty and stenting of the      mid circumflex coronary artery.  4. Percutaneous transluminal coronary angioplasty and stenting of the      mid-to-distal segment of the circumflex coronary artery.   INDICATIONS:  Ms. Arabia Nylund is a 77 year old female with known  coronary artery disease.  She is fairly complex with known peripheral  arterial disease.  She was admitted with unstable angina, underwent  cardiac catheterization last about 5 days ago by Dr. Clarene Duke, was found  to have high-grade stenosis of the dominant circumflex coronary artery.  The LAD in the proximal segment also had disease and diagonal 1 had 60-  70% disease.  She was initially referred for coronary artery bypass  grafting.  However, given her multiple comorbidities and recent  diagnosis of lung cancer, she was deemed to be not a candidate for  coronary artery bypass grafting.  Hence, she was referred for complex  coronary intervention of the circumflex coronary artery.   INTERVENTION DATA:  Successful PTCA and stenting of the distal  circumflex coronary artery with implantation of a 3.0 x 12-mm Vision  stent.  Stenosis was reduced from 99% to 0% with excellent brisk TIMI  III flow.   Successful PTCA and  stenting of the obtuse marginal 1 branch of the  circumflex coronary artery with implantation of a 3.0 x 15-mm Vision  stent deployed and reduced the stenosis from 90% to 0% with brisk TIMI  III flow maintained at the end of the procedure.   Successful PTCA and stenting of the mid circumflex coronary artery after  the origin of the obtuse marginal 1 branch of the circumflex coronary  artery.  A 3.5 x 15-mm Vision stent was deployed.  Post stent  implantation, there was a distal haziness with possibility for  dissection noted.  Because of diffuse nature of the disease, a long  overlapping stent 3.5 x 28-mm Vision stent was overlapped with a 3.5 x  15-mm stent in the mid segment and deployed.  Overall stenosis was  reduced from 70% to 0% with brisk TIMI III flow maintained at the end of  the procedure.   Overall, the patient had excellent results.   I attempted to perform a gentle kissing balloon angioplasty of the  obtuse marginal 1 and circumflex coronary artery.  However, I was unable  to pass a balloon into the obtuse marginal branch, I suspected that a  stent strut of the mid circumflex coronary artery is covering the  partially covering the ostium of the obtuse marginal branch.  However,  excellent results were noted.  I suspect she should have fairly good  clinical results.   RECOMMENDATIONS:  The patient will be discharged home in a couple of  days depending on her clinical situation.  She will need Plavix at least  for a period of 4 weeks.   A total of 170 mL of contrast was utilized for interventional procedure.   TECHNIQUE OF PROCEDURE:  Under sterile precautions using a 7-French  right femoral artery access, 7-French XB 3.5 guide was utilized and  engaged in the left main coronary artery.  Using Angiomax for  anticoagulation, an [employer], and ATW guidewire, the distal  circumflex coronary artery and obtuse marginal branch were wired and the  PDA branch was  predilated with a 2.74 x 12-mm Voyager at 12 atmospheric  pressure for 103 seconds followed by stent implantation 3.0 x 12-mm  Vision at peak of 18 atmospheric pressure.  Excellent results were noted  and direction was given to the obtuse marginal branch which was stented  with a 3.0 x 15-mm Vision stent.  Then, attention was directed towards  mid segment of the circumflex coronary artery after the origin of the  obtuse marginal 1 and this was stented with a 3.5 x 15-mm Vision.  There  was distal dissection noted which was covered with a 3.5 x 28-mm Vision  at 10 atmospheric pressure for 420 seconds and another 13 atmospheric  pressure for 17 seconds.  Having performed this, angiography revealed  excellent results.  The guidewires were withdrawn and angiography  repeated.  I attempted to try to withdrawing the wires and I attempted  to cross the obtuse marginal branch with a 3.0 x 12-mm [place] Voyager, but I  was unable to do so.  Hence, the lesion was left alone.  The patient  tolerated the procedure well.  There were no immediate complications.      Cristy Hilts. Jacinto Halim, MD     JRG/MEDQ  D:  01/23/2009  T:  01/24/2009  Job:  [id number]

## 2010-11-23 NOTE — Discharge Summary (Signed)
[name], [name]                 ACCOUNT NO.:  [id number]   MEDICAL RECORD NO.:  [id number]          PATIENT TYPE:  INP   LOCATION:  3714                         FACILITY:  [facility]   PHYSICIAN:  Dr. Tresa Endo              DATE OF BIRTH:  02-26-1931   DATE OF ADMISSION:  01/14/2009  DATE OF DISCHARGE:  02/12/2009                               DISCHARGE SUMMARY   MEDICATIONS:  The patient will be on HCTZ 25 mg one daily, in addition  to other medications already listed.      Darcella Gasman. [place], N.P.    ______________________________  Dr. Tresa Endo    LRI/MEDQ  D:  02/12/2009  T:  02/12/2009  Job:  [id number]   cc:   [facility]  Di Kindle. Edilia Bo, M.D.  Gabrielle Dare Janee Morn, M.D.  Antonietta Breach, M.D.  Dr. Marvis Repress understand

## 2010-11-23 NOTE — Procedures (Signed)
CAROTID DUPLEX EXAM   INDICATION:  Follow up of known carotid artery disease.  The patient  currently complains of right arm pain, which is constant.  Recent  surgical spine x-ray demonstrated bilateral cerebrovascular disease.  Last carotid duplex done 08/07 showed 1-39% stenosis of right ICA and 60-  79% stenosis of left ICA.   HISTORY:  Diabetes:  No.  Cardiac:  No.  Hypertension:  Yes.  Smoking:  Yes, 1 pack per day.  Previous Surgery:  Distant history of 2 intracranial aneurysm clipping  repairs in 02/1992 and 06/1992.  Bilateral CIA stents on 01/31/06.  CV History:  TIA in 02/1999.  Amaurosis Fugax:  No.  Paresthesias No.  Hemiparesis No.                                       RIGHT             LEFT  Brachial systolic pressure:         168               160  Brachial Doppler waveforms:         WNL               WNL  Vertebral direction of flow:        Antegrade         Antegrade  DUPLEX VELOCITIES (cm/sec)  CCA peak systolic                   72                50  ECA peak systolic                   90                Not visualized  ICA peak systolic                   87                284  ICA end diastolic                   22                80  PLAQUE MORPHOLOGY:                  Mixed, calcific   Mixed, calcific  PLAQUE AMOUNT:                      Mild              Moderate  PLAQUE LOCATION:                    Bifurcation, ICA  Bifurcation, ICA   IMPRESSION:  1. A 1-39% right internal carotid artery stenosis.  2. A 60-79% left internal carotid artery stenosis (velocities      increased somewhat from study done 08/07).  3. Patent right external carotid artery.  Unable to visualize left      external carotid artery suggestive of total occlusion.  4. Bilateral antegrade flow in vertebral arteries.   ___________________________________________  Janetta Hora Fields, MD   PB/MEDQ  D:  04/24/2007  T:  04/25/2007  Job:  [id number]

## 2010-11-23 NOTE — Assessment & Plan Note (Signed)
OFFICE VISIT   [name], [name]  DOB:  1930-12-05                                       12/05/2007  NWGNF#:[id number]   The patient returns for followup today after a left carotid  endarterectomy on May 7th.  She was seen on May 12th by Dr. Hart Rochester for  some swelling on the left side of her neck.  Since that time her  symptoms have improved.  She still has some mild numbness and tingling  in the left mandible.  However, she states that the swelling is improved  at the pain is also improved.  She has had no neurologic events and  denies any symptoms of amaurosis, TIA or stroke.   PHYSICAL EXAM:  Today blood pressure is 158/74 in the left arm, 189/85  in the right arm, pulse is 63 and regular.  Left neck incision is well-  healed.  There is a 2-3 cm in diameter full-feeling area under the  incision on the left side.  Otherwise there is no erythema or edema in  the left neck.  Tongue is midline.  Upper extremity and lower extremity  motor strength is 5/5.  She continues to take her aspirin.   The patient is recovering well from her left carotid endarterectomy.  She apparently had a small hematoma postoperatively.  This seems to be  resolving.  She also has some mild numbness under her left mandible  which is most likely some neurapraxia from the retractor.  This should  continue to resolve with time.   She will follow up with me in six months' time for repeat ABIs due to  the fact she has had previous common iliac stents in 2007.  We will also  do a carotid duplex at that time to make sure she has had no evidence of  restenosis.   Janetta Hora. Fields, MD  Electronically Signed   CEF/MEDQ  D:  12/05/2007  T:  12/06/2007  Job:  [id number]   cc:   Gaspar Garbe, M.D.

## 2010-11-23 NOTE — Op Note (Signed)
NAMEDELILA, KUKLINSKI                 ACCOUNT NO.:  [id number]   MEDICAL RECORD NO.:  [id number]          PATIENT TYPE:  INP   LOCATION:  3310                         FACILITY:  [facility]   PHYSICIAN:  Janetta Hora. Fields, MD  DATE OF BIRTH:  [birth year]   DATE OF PROCEDURE:  11/15/2007  DATE OF DISCHARGE:                               OPERATIVE REPORT   PROCEDURE:  Left carotid endarterectomy.   PREOPERATIVE DIAGNOSIS:  Asymptomatic high-grade left internal carotid  artery stenosis.   POSTOPERATIVE DIAGNOSIS:  Asymptomatic high-grade left internal carotid  artery stenosis.   ANESTHESIA:  General.   ASSISTANT:  Evern Bio, PA-C   OPERATIVE FINDINGS:  1. High-grade carotid stenosis left internal carotid artery.  2. Dacron patch.  3. A 10-French shunt.   OPERATIVE DETAILS:  After obtaining informed consent, the patient was  taken to the operating room.  The patient was placed in supine position  on the operating table.  After induction of general anesthesia and  endotracheal intubation, the patient's entire left neck and chest were  prepped and draped in usual sterile fashion.  An oblique incision was  made on the left side of the neck just anterior to border of the left  sternocleidomastoid muscle.  Incision was carried down through the  subcutaneous tissues down the level of the platysma.  The platysma was  incised.  Dissection was carried down the level of the  sternocleidomastoid muscle.  This was reflected laterally.  Dissection  was then carried down to the level of the jugular vein.  This was also  reflected laterally.  Common facial vein was dissected free  circumferentially and ligated and divided between silk ties.  Common  carotid artery was identified.  The patient had a fairly low  bifurcation, and to obtain adequate control of the proximal common  carotid artery, this required division of the omohyoid muscle.  Common  carotid artery was dissected free at this level of  this location.  The  vagus nerve was identified and protected.  Umbilical tape was placed  around the common carotid artery.  Dissection was then carried up above  the level of the carotid bifurcation.  The ansa cervicalis was  identified and traced up to the level of the hypoglossal nerve.  Hypoglossal nerve was identified and protected.  Ansa cervicalis was  divided.  Several small vein branches up in the upper portion of neck  were also ligated and divided between silk ties to expose the distal  internal carotid artery.  There was mild-to-moderate tortuosity of the  internal carotid artery after the bifurcation.  Internal carotid artery  was dissected free above the level of the plaque.  A vessel loop was  placed around this.  The external carotids and superior thyroid arteries  were also dissected free circumferentially and vessel loops were placed  around these.  The patient was then given 7000 units of intravenous  heparin.  Distal internal carotid artery was clamped after raising the  mean arterial pressure above 90.  The common carotid and external  carotid artery were controlled with vessel  loops and a peripheral  DeBakey clamp respectively.  Next, a longitudinal opening was made in  the common carotid artery just below the carotid bifurcation.  This  opening was extended up through the level of stenosis.  The stenosis was  a large exophytic plaque which was greater than 80%.  There was also  heavy calcification.  A 10-French shunt was then brought up in the  operative field and threaded into the distal internal carotid artery and  allowed to back bleed thoroughly.  This was then threaded down into the  common carotid artery.  It was then inspected and found to have 1 small  air bubble.  It was removed gently from the common carotid artery and  allowed to back bleed thoroughly again.  This was then threaded back in  the common carotid artery, controlled with an umbilical tape.   The shunt  was then inspected and found be free of air.  The shunt was opened with  restoration of flow to the brain after approximately 6 minutes.  Next,  endarterectomy was begun in a suitable plane just below the carotid  bifurcation.  A good distal endpoint was obtained in the distal internal  carotid artery.  The external carotid artery was endarterectomized by  eversion technique.  A good feathered endpoint was also obtained  proximally in the common carotid artery.  Next, a Dacron patch was  brought up in the operative field and sewn on this patch with  angioplasty using running 6-0 Prolene suture.  Just prior to completion  of anastomosis, the shunt was reclamped and removed fresh in the distal  internal carotid artery, and allowed to back bleed thoroughly.  It was  then removed from the proximal common carotid artery and this was  allowed to forward bleed thoroughly.  The external carotid artery was  also thoroughly back bled and then the entire thing was flushed with  heparinized saline.  Remainder of the anastomosis was completed.  Clamp  was first removed from the external carotid artery, followed by the  common carotid artery and after approximately 6 cardiac cycles, the flow  was restored to the internal carotid artery.  Doppler was used to  examine the internal and external common carotid arteries and these all  had good flow.  Next, hemostasis was obtained.  The platysma muscle was  reapproximated using running 3-0 Vicryl suture.  Skin was closed with 4-  0 Vicryl subcuticular stitch.  The patient tolerated the procedure well  and there were no complications.  All instrument, sponge, and needle  counts were correct at the end of the case.  The patient was brought to  the recovery room in stable condition, moving upper extremities and  lower extremities symmetrically and tongue was midline.      Janetta Hora. Fields, MD  Electronically Signed     CEF/MEDQ  D:   11/15/2007  T:  11/16/2007  Job:  [id number]

## 2010-11-23 NOTE — Op Note (Signed)
[name], [name]                 ACCOUNT NO.:  [id number]   MEDICAL RECORD NO.:  [id number]          PATIENT TYPE:  OUT   LOCATION:  XRAY                         FACILITY:  [facility]   PHYSICIAN:  James L. Malon Kindle., M.D.DATE OF BIRTH:  24-Dec-1930   DATE OF PROCEDURE:  02/06/2009  DATE OF DISCHARGE:  01/19/2009                               OPERATIVE REPORT   PROCEDURE:  Sigmoidoscopy and biopsy.   MEDICATIONS:  Fentanyl 20 mcg, Versed 2 mg IV.   INDICATIONS:  A 77 year old woman who has had multiple issues.  She came  in with a retroperitoneal hematoma with ileus, required surgery and this  is following the catheterization stent insertion.  She had elevated  liver tests.  These have gradually improved and a retroperitoneal  hematoma has been reabsorbed.  She has had profuse diarrhea.   DESCRIPTION OF PROCEDURE:  The procedure was done on an unprepped colon.  The Pentax pediatric colonoscope was inserted and advanced.  Upon  entering the colon, we advanced to approximately 50 cm, there is a large  amount of liquid stool which was suctioned for a sample to the lab.  The  mucosa lining appeared grossly normal and several random biopsies were  taken.  There were no signs of gross colitis or pseudomembranes.  The  patient tolerated the procedure well.   ASSESSMENT:  Diarrhea of unclear etiology with no gross colitis or  pseudomembranes seen on sigmoidoscopy.   PLAN:  We will check the path results and send the stool sample for C.  diff culture, white blood cell smear.  We will go ahead and give the  patient Lomotil empirically.           ______________________________  Llana Aliment Malon Kindle., M.D.     Waldron Session  D:  02/06/2009  T:  02/07/2009  Job:  [id number]   cc:   Thereasa Solo. Little, M.D.

## 2010-11-23 NOTE — Consult Note (Signed)
[name], [name]                 ACCOUNT NO.:  [id number]   MEDICAL RECORD NO.:  [id number]          PATIENT TYPE:  OUT   LOCATION:  XRAY                         FACILITY:  [facility]   PHYSICIAN:  Gabrielle Dare. Janee Morn, M.D.DATE OF BIRTH:  1930-07-16   DATE OF CONSULTATION:  DATE OF DISCHARGE:  01/19/2009                                 CONSULTATION   REASON FOR CONSULTATION:  Retroperitoneal hematoma and ileus.   HISTORY OF PRESENT ILLNESS:  [name] is a 77 year old white female who  underwent percutaneous coronary intervention on Friday January 23, 2009, by  Dr. Jacinto Halim.  She developed a retroperitoneal hematoma extending up from  her right groin into her retroperitoneum.  The patient required a  transfusion, but at this point is hemodynamically stable, she continues  to have pain, however, from this retroperitoneal hematoma and has  developed an associated ileus.  We were asked to evaluate from a  surgical standpoint.   PAST MEDICAL HISTORY:  1. Coronary artery disease.  2. COPD.  3. CVA.  4. DDD.   PAST SURGICAL HISTORY:  1. Colectomy for diverticulitis done by Dr. Ovidio Kin from our      practice.  2. She has also had left carotid endarterectomy.  3. Appendectomy.  4. Cerebral aneurysm repair.  5. Lumbar fusion.   CURRENT MEDICATIONS:  Cozaar, Imdur, Lopressor which is on hold, Zocor,  Ecotrin, Pepcid, Protonix, and Plavix.   ALLERGIES:  ROCEPHIN, CIPRO, LIPITOR, SULFA, and VALIUM.   REVIEW OF SYSTEMS:  As far as GI standpoint, she has some abdominal  pain, especially in the right lower quadrant associated with this  hematoma.  She also has some acute nausea and vomiting.  Remainder of  her review of systems is unremarkable at this time.   PHYSICAL EXAMINATION:  VITAL SIGNS:  Temperature 98.8, blood pressure  110/57, heart rate 107, respiratory rate 22, saturation 98%.  GENERAL:  She is awake and alert, in no distress, though she is somewhat  uncomfortable.  NECK:   Supple.  LUNGS:  Clear to auscultation with no significant wheezing.  HEART:  Cardiac is regular with a rate of 110.  EXTREMITIES:  She has no significant peripheral edema.  Right lower  extremity is warm with good cap refill.  ABDOMEN:  Distended but soft.  She does have a palpable mass and  fullness in the right lower quadrant consistent with this large  retroperitoneal hematoma.  She has a few bowel sounds, but these are  hypoactive.  She is tender, especially in the area of this palpable  hematoma.  There is no generalized peritonitis.  She does not have a  significant right groin mass.   CT scan done on January 23, 2009, demonstrates this hematoma as described  above.  No other intra-abdominal abnormalities were noted.   LABORATORY STUDIES:  Sodium 138, potassium 4.8, chloride 109, CO2 19,  creatinine 2.12.  Hemoglobin 10, platelets 153.   IMPRESSION:  1. Retroperitoneal hematoma.  2. Associated ileus.   RECOMMENDATIONS:  I agree with the following her hemoglobin closely.  I  agree with Dr.  Solomon's plan for followup CT scan of the abdomen and  pelvis if her hemoglobin continues to drop significantly.  I would  continue her Protonix.  I would write for some Zofran p.r.n. for nausea.  We will continue following her closely.  Hopefully, the bleeding will  stop; otherwise, consideration may need to be given to holding her  Plavix and aspirin and I know that it is quite dangerous in light of her  recent percutaneous coronary intervention.      Gabrielle Dare Janee Morn, M.D.  Electronically Signed     BET/MEDQ  D:  01/25/2009  T:  01/26/2009  Job:  [id number]   cc:   Ritta Slot, MD

## 2010-11-26 NOTE — Discharge Summary (Signed)
NAMEPETRONELLA, [name]                 ACCOUNT NO.:  [id number]   MEDICAL RECORD NO.:  [id number]          PATIENT TYPE:  INP   LOCATION:  3310                         FACILITY:  [facility]   PHYSICIAN:  Janetta Hora. Fields, MD  DATE OF BIRTH:  02/22/1931   DATE OF ADMISSION:  11/15/2007  DATE OF DISCHARGE:  11/16/2007                               DISCHARGE SUMMARY   ADMISSION DIAGNOSIS:  High-grade left internal carotid artery stenosis,  asymptomatic.   DISCHARGE/SECONDARY DIAGNOSES:  1. High-grade left internal carotid artery stenosis, asymptomatic;      status post left carotid endarterectomy.  2. Hypertension.  3. Peripheral artery disease, status post bilateral iliac artery      stenting in 2007.  4. Back surgery in 2004.  5. Partial colectomy in 1997 for ulcerative colitis.  6. Craniotomy for aneurysm x2 in 1992.  7. Hysterectomy in 1975.  8. Hemorrhoidectomy in 1970.  9. Appendectomy in 1958.  10.Ongoing tobacco abuse.  11.Multiple allergies including Rocephin, Lipitor, Valium, and sulfa.  12.Hypercholesterolemia.   PROCEDURE:  On Nov 15, 2007, left carotid endarterectomy with Dacron  patch angioplasty by Dr. Fabienne Bruns.   BRIEF HISTORY:  Ms. Pong is a 77 year old female who Dr. Darrick Penna has been  following for bilateral common iliac artery disease with status post  stents in July 2007.  She reported a pre-existent history of field cut  on the right side that had been present since 2002.  She had undergone  carotid duplex showing high-grade stenosis of the left internal carotid  artery.  She had no history of TIA, amaurosis, or stroke.  Dr. Darrick Penna  reviewed her carotid duplex, which showed 80% stenosis on the left and  40% stenosis on the right.  Peak systolic velocity on the left side was  458 cm/second.  He felt she would benefit from elective left carotid  endarterectomy to reduce her risk for future stroke.  Of note, she had  reported some occasional chest pains, and  therefore, prior scheduling  surgery, she was evaluated by cardiologist, Dr. Yates Decamp.  He started  her on beta-blocker therapy and ordered an echocardiogram with  Persantine Myoview and was ultimately cleared for surgery.   HOSPITAL COURSE:  Ms. Colford was electively admitted to [facility] on Nov 15, 2007.  She underwent the previously mentioned  procedure.  She had a uneventful postoperative course.  After a short  stay in the recovery, she was transferred to step-down unit 330 where  she remained until discharge.  On postoperative day #1, vitals were  stable; systolic blood pressure was in the 150s.  Her neck showed no  evidence of hematoma.  Neurologically, she was intact.  Labs showed  sodium of 139, potassium 3.6, glucose 127, BUN of 12, and creatinine  0.89.  White count was 9.8, hemoglobin 12.4, hematocrit 35.6, and  platelet count 217.  That morning, she tolerated advancement of her  diet, was able to mobilize without difficulty, and her pain was  controlled with oral medication, and was therefore felt appropriate for  discharge home on postoperative day #  1 in stable condition.   DISCHARGE MEDICATIONS:  1. Atenolol 25 mg p.o. b.i.d.  2. Lisinopril 10 mg p.o. daily.  3. Zocor 40 mg p.o. daily.  4. Aspirin 325 mg daily.  5. Advil 200 mg p.r.n. per Dr. Darrick Penna' instruction.  6. Percocet 5/325 one tablet p.o. q.4h. p.r.n. pain.   DISCHARGE INSTRUCTIONS:  Continue heart healthy diet.  No driving or  heavy lifting for the next 2 weeks.  Clean incision gently with soap  water and call if she develops fever greater than 101 or change in her  neurologic status or infection at her incision site.  Dr. Darrick Penna will  see her in 2 weeks.  Our office will contact her regarding specific  appointment, date, and time.      Jerold Coombe, P.A.      Janetta Hora. Fields, MD  Electronically Signed    AWZ/MEDQ  D:  01/29/2008  T:  01/30/2008  Job:  [id number]   cc:   Cristy Hilts.  Jacinto Halim, MD  Gaspar Garbe, M.D.

## 2010-11-26 NOTE — Discharge Summary (Signed)
NAME:  [name], [name]                           ACCOUNT NO.:  [id number]   MEDICAL RECORD NO.:  [id number]                   PATIENT TYPE:  INP   LOCATION:  0378                                 FACILITY:  [facility]   PHYSICIAN:  Sherin Quarry, MD                   DATE OF BIRTH:  12-04-1930   DATE OF ADMISSION:  05/31/2002  DATE OF DISCHARGE:  06/02/2002                                 DISCHARGE SUMMARY   HISTORY OF PRESENT ILLNESS:  The patient is a 77 year old lady whose chronic  medical problems include chronic bronchitis, hypertension, depression,  hyperlipidemia, history of cerebral aneurysm repair, an 80 pack year smoking  history.  The patient smokes about 1-1/2 packs of cigarettes per day, and  has symptoms consistent with chronic bronchitis.  On Monday night she noted  the onset of increased cough, as well as pain in her chest secondary to  coughing.  She could not sleep very well on Monday and Tuesday.  On  Wednesday, she was seen at [facility] and was treated with  prednisone, Tussionex, and Augmentin.  On the day of presentation, i.e.,  05/31/02, she was noted to be feeling weak, increased shortness of breath,  and was somewhat tachypneic.  Her symptoms did not seem to get better with  nebulizer treatment, and therefore she was referred to the [facility] Emergency Room.   PHYSICAL EXAMINATION:  HEENT:  Within normal limits.  CHEST:  Diminished breath sounds diffusely.  There were moderate expiratory  wheezes.  CARDIOVASCULAR:  Normal S1 and S2 without murmurs, rubs, or gallops.  ABDOMEN:  Benign.  NEUROLOGIC:  Normal.  EXTREMITIES:  Normal.   LABORATORY DATA:  The chest x-ray obtained at the time of admission revealed  prominent interstitial markings with evidence of chronic bronchitis and left  basilar atelectasis.  No acute findings were identified.  Laboratory  studies:  A CBC and BMET were within normal limits.   HOSPITAL COURSE:  On  admission, the patient was placed on Zithromax 500 mg  IV daily.  She was given Solu-Medrol 120 mg IV x1, and then was placed on  prednisone 60 mg q.d.  Nebulizer treatments with albuterol were instituted.  Tussionex was continued one teaspoon q.12h.  The patient's course was one of  slow improvement.  I repeatedly discussed with her the importance of  absolutely never smoking any more cigarettes in the future.  Her daughter  also reinforced these statements.  By 06/02/02, the patient still had some  residual symptoms.  It was felt reasonable to discharge her.   DISCHARGE DIAGNOSES:  1. Acute exacerbation of chronic bronchitis.  2. Hypertension.  3. History of depression.  4. Hyperlipidemia.  5. History of cerebral aneurysm repair.  6. An 80 pack year smoking history.  7. History of non-cardiac chest pain.   DISCHARGE MEDICATIONS:  1. Mevacor 80 mg  q.d.  2. Hydrochlorothiazide 25 mg q.d.  3. Aspirin 325 mg q.d.  4. Atenolol 25 mg q.d.  5. Prilosec 20 mg q.d.  6. Prednisone which the patient will take by a tapering course:  40 mg x3     days, 30 mg x3 days, 20 mg x3 days, 10 mg x3 days, and then stop.  7. Zithromax 250 mg q.d. x5 days.  8. Nebulizer for home use, and will be instructed to use albuterol 5 mg     vials q.4h. and p.r.n.   FOLLOWUP:  Dr. Shaune Pollack at [facility] in three to five  days.                                               Sherin Quarry, MD    SY/MEDQ  D:  06/02/2002  T:  06/02/2002  Job:  [id number]   cc:   Duncan Dull, M.D.  [address]  [place]  [place] [address]  Fax: [phone]

## 2010-11-26 NOTE — Discharge Summary (Signed)
NAME:  [name], [name]                           ACCOUNT NO.:  [id number]   MEDICAL RECORD NO.:  [id number]                   PATIENT TYPE:  INP   LOCATION:  0378                                 FACILITY:  [facility]   PHYSICIAN:  Sherin Quarry, MD                   DATE OF BIRTH:  1931-01-08   DATE OF ADMISSION:  05/31/2002  DATE OF DISCHARGE:  06/02/2002                                 DISCHARGE SUMMARY   HISTORY OF PRESENT ILLNESS:  The patient is a 77 year old lady with an 80  pack year smoking history who continues to smoke about 1-1/2 packs of  cigarettes per day.  On the Monday evening prior to admission she noted  increased cough as well as generalized chest aching secondary to coughing.  She had difficulty sleeping both Monday night and Tuesday night secondary to  coughing.  On Wednesday, she was seen at [facility] and  was treated with prednisone, Tussionex p.r.n. for cough, and Augmentin.  She  was seen back the next day, and there really did not seem to be very much  improvement.  She continued to have a substantial degree of coughing and  chest congestion, and was quite uncomfortable.  She was moderately  tachypneic.  She was therefore referred to [facility] Emergency  Room for further evaluation.   PHYSICAL EXAMINATION:  HEENT:  Within normal limits.  CHEST:  Diminished breath sounds diffusely with moderate expiratory  wheezing.  CARDIOVASCULAR:  Normal S1 and S2, without murmurs, rubs, or gallops.  ABDOMEN:  Benign.  Normal bowel sounds without masses, tenderness, or  organomegaly.  NEUROLOGIC:  Normal.  EXTREMITIES:  Normal.   LABORATORY DATA:  Chest x-ray showed evidence of chronic obstructive  pulmonary disease.  There were no acute infiltrates.   Working diagnosis was acute exacerbation of chronic bronchitis.   HOSPITAL COURSE:  The patient was placed on nebulizer therapy with albuterol  and Atrovent which she utilized q.4h. and p.r.n.   She was placed on  Zithromax 500 mg IV daily, and was given Solu-Medrol 120 mg IV daily, and  then was placed on prednisone 60 mg daily.  Her usual medications were  continued, and she continued to receive Tussionex one teaspoon q.12h. p.r.n.  for cough.  During the patient's hospitalization, several discussions were  carried out about the extreme importance of discontinuing cigarette smoking.  On 06/02/02, although the patient continued to have a persistent cough and  moderate chest congestion, it was felt that her condition could be  adequately treated as an outpatient.  Therefore, she was discharged.  The  importance of compliance with her medical regimen was strongly emphasized.   DISCHARGE DIAGNOSES:  1. Acute exacerbation of chronic bronchitis.  2. Chronic bronchitis.  3. An 80 pack year smoking history.  4. Hypertension.  5. History of depression.  6. Hyperlipidemia.  7. History of  cerebral aneurysm repair.  8. Status post hysterectomy.    DISCHARGE MEDICATIONS:  1. The patient will be dispensed a nebulizer for home use to utilize with     albuterol and Atrovent.  This can be used q.4h. and p.r.n.  2. Zithromax 250 mg q.d. x5 days.  3. She will receive a tapering schedule of prednisone 40 mg x3 days, 30 mg     x3 days, 20 mg x3 days, 10 mg x3 days, then stop.  4. She will continue all her usual medications, i.e., Mevacor,     hydrochlorothiazide, aspirin, atenolol, and Prilosec.   FOLLOWUP:  She was instructed to follow up with Dr. Shaune Pollack at  [facility] on Wednesday or Thursday.   The patient was told that she absolutely must discontinuing cigarette  smoking at this time, as if she continues smoking it may result in her  death.  Her daughter is going to make an effort to try to get all cigarettes  out of her house to reduce visual cues.  Of course, this can be reinforced  as an outpatient.   CONDITION ON DISCHARGE:  Fair.                                                Sherin Quarry, MD    SY/MEDQ  D:  06/02/2002  T:  06/02/2002  Job:  [id number]   cc:   Duncan Dull, M.D.  [address]  [place]  [place] [address]  Fax: [phone]

## 2010-11-26 NOTE — H&P (Signed)
NAME:  [name], [name]                           ACCOUNT NO.:  [id number]   MEDICAL RECORD NO.:  [id number]                   PATIENT TYPE:  EMS   LOCATION:  MAJO                                 FACILITY:  [facility]   PHYSICIAN:  Casimiro Needle L. Thad Ranger, M.D.           DATE OF BIRTH:  June 19, 1931   DATE OF ADMISSION:  07/09/2002  DATE OF DISCHARGE:                                HISTORY & PHYSICAL   REASON FOR EVALUATION:  Feel weird. Left-sided numbness and abnormal CT  scan.   HISTORY OF PRESENT ILLNESS:  This is a [facility] admission for  this 77 year old woman who awoke this morning feeling well but at about 8  o'clock developed sudden onset of a weird sensation.  She says that she  felt light headed, somewhat dizzy and vertiginous. On further questioning  she thinks that she might have had a little blurred vision as well as some  numbness in the left side of her face and tongue. She was brought by her  daughter to the emergency room and insists that she feels better, but she  still is a little bit dizzy and unsteady when she tries to sit or stand up.   A CT scan of the head was done in the emergency room and revealed a small  intracranial hemorrhage and neurology was called for further management. She  denies any history of previous similar symptoms or any known previous  history of stroke.   PAST MEDICAL HISTORY:  Remarkable for:  1. Hypertension. She had a recent change in her medications.  2. She denies any history of diabetes.  3. She does have a history of hypercholesteremia.  4. She was taken off Atenolol and placed on Cartia recently due to a     question of asthma or COPD and was on nebulizers recently, but that has     been doing better.  5. She had a right retinal artery branch occlusion a few years ago which     left her with some restricted vision in the medial upper quadrant out of     the right eye only.  6. She had aneurysm clipping in 1992.  7. She had a  recent cardiac stress test by Dr. Amil Amen in July, which was     unremarkable.   FAMILY HISTORY:  Negative for stroke.   SOCIAL HISTORY:  She does not smoke or consume alcohol. She is normally  independent in her activities of daily living.   ALLERGIES:  SHE REPORTS ALLERGIES TO:  1. CIPRO.  2. SULFA.  3. ROCEPHIN.  4. LIPITOR.   MEDICATIONS:  1. Aspirin.  2. Hydrochlorothiazide.  3. Cartia 240 mg q.d., new.  4. Mevacor.  5. Prilosec.  6. She recently discontinued Atenolol.   REVIEW OF SYSTEMS:  HEENT:  No headaches. Eyes, blurred vision as above. No  hearing changes. CONSTITUTIONAL:  No fever or chills. CARDIOVASCULAR:  No  chest pain or palpitations. RESPIRATORY:  No shortness of breath or cough.  GI:  Some nausea, no vomiting or abdominal pain. SKIN:  No rash.  MUSCULOSKELETAL: She does report some persistent lower back pain radiating  through the left hip and into the left lower extremity a bit. GU:  No  urinary symptoms.   PHYSICAL EXAMINATION:  VITAL SIGNS:  Temperature 98.3, blood pressure  158/67, pulse 77, respirations 20.  GENERAL:  She is alert and in no distress. Speech is fluent and not  dysarthric. Mood is euthymic and affect appropriate.  HEENT:  Normocephalic, atraumatic.  NECK:  Supple without carotid bruits.  HEART:  Regular rate and rhythm.  CHEST:  Clear to auscultation.  ABDOMEN:  Obese but soft and otherwise benign.  EXTREMITIES:  No edema, 2+ pulses.  NEUROLOGIC:  Mental status as above. Cranial nerves: Funduscopic examination  was benign, although there is an area of paleness in the inferior part of  the right retina. Pupils equal and briskly reactive. Extraocular movements  normal without nystagmus. Visual fields full to confrontation with tested  binocularly. Hearing is intact especially to finger rub. Facial sensation is  diminished to pinprick on the left compared to the right. Face, tongue and  palate all move normally and symmetrically.  Shoulder shrug strength is  normal.  MOTOR:  Motor testing, normal bulk and tone, normal strength in all tested  extremity muscles.  SENSATION:  Some diminished pinprick in the left hand compared to the right,  otherwise intact throughout.  REFLEXES:  Diminished throughout. Toes are downgoing.  CEREBELLAR:  Finger-to-nose is performed well. Rapid alternating movements  are performed a bit slowly on the left hand.  GAIT: She reports some dizziness  when standing up, but appears steady on  her feet. She ambulates well and can toe walk a little bit.   LABORATORY DATA:  CBC:  White cells 7.2, hemoglobin 12, platelets 224,000,  CMET normal except for slightly elevated glucose of 112, coagulations are  normal.   A CT scan of the head is personally reviewed and demonstrates an 8 mm acute  intracerebral hemorrhage on the right centrum semiovale.   IMPRESSION:  Mild right intracerebral hemorrhage with minor symptoms.    PLAN:  Will check a followup CT scan in the morning. She cannot have an MRI  due to her aneurysm clips. Will also check carotid Dopplers. Will hold  aspirin. Will likely be able to discharge her in the morning if she feels  well.                                               Michael L. Thad Ranger, M.D.    MLR/MEDQ  D:  07/09/2002  T:  07/09/2002  Job:  [id number]   cc:   Duncan Dull, M.D.  [address]  [place]  [place] [address]  Fax: [phone]

## 2010-11-26 NOTE — Op Note (Signed)
[name], [name]                 ACCOUNT NO.:  [id number]   MEDICAL RECORD NO.:  [id number]          PATIENT TYPE:  INP   LOCATION:  5524                         FACILITY:  [facility]   PHYSICIAN:  Janetta Hora. Fields, MD  DATE OF BIRTH:  October 21, 1930   DATE OF PROCEDURE:  01/31/2006  DATE OF DISCHARGE:  02/01/2006                                 OPERATIVE REPORT   PROCEDURES:  1.  Aortogram with bilateral lower extremity runoff.  2.  Bilateral common iliac artery angioplasty and stenting.   PREOPERATIVE DIAGNOSIS:  Blue toe syndrome left foot.   POSTOPERATIVE DIAGNOSIS:  Blue toe syndrome left foot.   ANESTHESIA:  Local with IV sedation.   OPERATIVE DETAILS:  After obtaining informed consent, the patient was taken  to the [facility] lab.  The patient was placed in supine position on the angio  table.  Both groins were prepped and draped in the usual sterile fashion.  Local anesthesia was infiltrated over the right common femoral artery.  A  Majestic needle was used to cannulate the right common femoral artery, a  0.035 Rosen wire advanced into the abdominal aorta under fluoroscopic  guidance.  Next a 5 French sheath was placed over the guide wire in the  right common femoral artery.  A 5 French pigtail catheter was then placed  over the guide wire into the abdominal aorta.  Abdominal aortogram was then  obtained.  It shows that the left and right renal arteries are patent.  There is mild to moderate atherosclerotic change of the abdominal aorta.  There is hardware overlying the aortic bifurcation from previous lumbar  spine surgery.  Therefore, several oblique views were obtained to remove the  artifact of the hardware.  This shows a large plaque in the left common  iliac artery, with approximately 75% stenosis.  This is best seen on an RAO  view of approximately 68 degrees angulation.   Next, lower extremity runoff views were obtained.  It shows that the left  and right external iliac and  internal iliac arteries are patent.  Right and  left common femoral arteries are patent, as well as the profunda femoris and  superficial femoral arteries.  Left and right popliteal arteries are patent.  Tibioperoneal trunk and proximal origin of the anterior tibial artery is  patent bilaterally.  Anterior tibial artery occludes distally, and the  primary runoff is via the posterior tibial and peroneal artery on both lower  extremities.   At this point, access was obtained on the left side by anesthetizing the  area over the left common femoral artery.  A Majestic needle was used to  cannulate the left common femoral artery, and a 0.035 Wholey wire advanced  into the abdominal aorta under fluoroscopic guidance.  A 7 French sheath was  then placed into the left common femoral artery.  Maintaining guide wire  access, the right 5 French sheath was then exchanged over a guide wire for 7  French long bright tip sheath.  On the left side, the sheath was advanced  over the guide wire to  predilate the left iliac lesion.  This was done after  administration of 5000 units of heparin.  The sheath on both sides was then  advanced up to the level of the aortic bifurcation.  Two 8 x 29 Genesis  balloon expandable stents were then brought up on the operative field and  placed at the level of the iliac bifurcation.  Using a kissing balloon  technique, the stents were then inflated to 8 atmospheres.  There was a  formation of a dog bone on the left iliac lesion, and then this extended.  Pigtail catheter was then placed over the guide wire through the 7 French  sheath after removal of both balloons.  This shows that the left and right  common iliac arteries are patent.  There was a small area of dissection  along the posterior aspect of the right common iliac artery.  There was no  extravasation of contrast material. and the patient had no pain during this  portion of the procedure.  This was left as is to  be observed, as redilating  and stenting this area was thought that it would probably make the area of  dissection possibly worse.  A repeat lower extremity runoff view was  obtained of the left leg to make sure that there had been no embolization,  and this again showed wide patency of the superficial femoral artery and the  tibial arteries all the way down to level the foot.  The dominant runoff  vessel on the left side is the posterior tibial artery.  Both 7 French  sheaths were pulled back into the distal external iliac artery.  The patient  was taken to the recovery area for the sheaths to be pulled after the ACT  was less than 175.  The patient tolerated the procedure well, and there were  no complications.  The patient was taken the holding area in stable  condition.   OPERATIVE FINDINGS:  1.  75% stenosis of left common iliac artery.  2.  Bilateral common iliac artery stenting, 8 x 29 stents.  3.  Small area of dissection right common iliac artery.      Janetta Hora. Fields, MD  Electronically Signed     CEF/MEDQ  D:  02/02/2006  T:  02/02/2006  Job:  [id number]

## 2010-11-26 NOTE — Discharge Summary (Signed)
NAME:  [name], [name]                           ACCOUNT NO.:  [id number]   MEDICAL RECORD NO.:  [id number]                   PATIENT TYPE:  INP   LOCATION:  5017                                 FACILITY:  [facility]   PHYSICIAN:  Sharolyn Douglas, M.D.                     DATE OF BIRTH:  April 23, 1931   DATE OF ADMISSION:  02/19/2003  DATE OF DISCHARGE:  02/25/2003                                 DISCHARGE SUMMARY   ADMISSION DIAGNOSES:  1. History of cerebral aneurysm in 1992 status post intracranial hemorrhage     in December 2003.  2. Hypertension.  3. History of right retinal artery occlusion.  4. Hyperlipidemia.  5. Chronic bronchitis.  6. History of sigmoid colectomy for diverticulitis.   DISCHARGE DIAGNOSES:  1. Status post L4-5 posterior spinal fusion with pedicle screws.  2. Postoperative hemorrhagic anemia that did not require blood transfusion.  3. Postoperative hyponatremia that resolved prior to discharge.  4. Postoperative hypokalemia that resolved prior to discharge.  5. History of cerebral aneurysm in 1992 status post intracranial hemorrhage     in December 2003.  6. Hypertension.  7. History of right retinal artery occlusion.  8. Hyperlipidemia.  9. Chronic bronchitis.  10.      History of sigmoid colectomy for diverticulitis.   CONSULTS:  Dr. Sydnee Levans, internal medicine.   PROCEDURE:  Laminectomy at L4-5, TLIF at L4-5, and posterior spinal fusion  with pedicle screws at L4-5. This was done by Sharolyn Douglas, M.D., assistant  [facility]. Anesthesia was general.   LABORATORY DATA:  On admission, CBC with differential was within normal  limits with the exception of hematocrit of 35.2. Postoperatively, H and H  was monitored on daily basis which reached a low of 8.5 and 24.3  respectively on February 24, 2003; however, she was asymptomatic and did not  require transfusion. PT, INR, and PTT within normal limits. Complete  metabolic profile from admission was within  normal limits. During her  hospital stay on February 23, 2003, she developed mild hyponatremia at 131 and  hypokalemia at 3.2. These did return to normal prior to discharge. Glucose  was slightly elevated on two occasions at 141 and 137 on August 15 and  August 16 respectively. UA on admission was negative. Blood type from February 14, 2003 was type A, ______ positive, antibody screen negative. Venous  Doppler of lower extremities done on February 21, 2003 showed no evidence of  DVT, superficial thrombosis, or Baker's cyst bilaterally. On February 19, 2003, fluoroscopy was used for intraoperative localization. EKG is not seen  on the chart.   BRIEF HISTORY:  The patient is a 77 year old with progressively worsening  back pain and bilateral lower extremity pain. She had radicular type pain in  the left leg in the L5 distribution. Her pain and problems had been getting  significantly worse over  the last month. She has tried numerous types of  conservative management including anti-inflammatory medications, narcotic  pain medications, activity modification, and rest as well as physical  therapy and injections. Unfortunately, none of these have given her any  significant relief of her symptoms. She reports pain is severe. It is  interfering with her activities of daily living as well as her quality of  life, keeping her up at night. Secondary to severe findings and loss of  function, it was thought that her best course of management at this point  would be posterior spinal fusion. The patient's risks and benefits of the  procedure were discussed by Dr. Noel Gerold as well as myself. She indicated  understanding and opted to proceed.   HOSPITAL COURSE:  On February 19, 2003, the patient was admitted to the  hospital and taken to the operating room for the above listed procedure. She  tolerated the procedure well without any intraoperative complications.  Approximately 250 cc of blood loss. One Hemovac drain  was placed. She was  transferred to the recovery room in stable condition.   Postoperatively, normal orthopedic spine postoperative orders were followed  which include NPO until flatus at which time diet was slowly advanced,  incentive q.1h., STDs, TEDS ose as well as early mobilization for DVT  prophylaxis, control of pain using a combination of Percocet p.o. as well  morphine PCA. She was transitioned over to Percocet p.o. strictly by  postoperative day #1, and she did well. Pain control did remain adequate.  Physical therapy and occupational therapy were consulted to work with the  patient on progressive ambulation program. Prophylactic antibiotics for 48  hours which she tolerated without difficulty. On February 20, 2003, the  patient was doing very well. She did get slightly sedated secondary to  Phenergan that was given to her for nausea. She was running a very low grade  temperature of 100.2 degrees. She was also complaining of calf pain at that  time; therefore, venous Doppler was ordered of the lower extremities which  as previously dictated ruled out DVT. By postoperative day #2, she was  feeling better. Her hemoglobin was 8.9; however, she was asymptomatic, and  she was not interested in transfusion unless absolutely necessary.  Therefore, we continued to monitor, and her vital signs were stable.  Dressing was changed, and incision looked good. Daily dressing changes were  done thereafter, and it continued to look good throughout her hospital stay.  PCA was discontinued. Foley was discontinued without difficulty. On February 22, 2003, the patient was doing well. She had not had bowel movement at that  point. Therefore, she was given a laxative which actually planned to give  her magnesium citrate; however, she refused that because she thought it  would make her sick. A Dulcolax suppository was given and unfortunately did not get a good response from that on that day. The following  day, secondary  to not having a bowel movement and abdominal distention and mild tenderness,  her diet was held until she had a bowel movement. Her discharge was also  held for the same reason. By February 24, 2003, the patient had had a bowel  movement. Her anemia was stable at 8.9. Physical therapy and occupational  therapy, she was progressing slowly with them secondary to her abdominal  issues; however, by February 25, 2003, the patient had met all goals  orthopedically. She was stable, ambulating on her own power. She was feeling  well. Her  abdominal pain had resolved, and she was ready for discharge home.  Vital signs were stable. She was afebrile. Motor and sensation were intact.  Incision looked good without any signs or symptoms of infection. Abdomen was  soft, nontender, nondistended.   DISCHARGE PLAN:  The patient is a 77 year old female status post posterior  spinal fusion, doing well.   ACTIVITY:  Daily ambulation, brace on when up. Dressing changes daily. Back  precautions were discussed and understood. No lifting greater than 5 pounds.  The patient may shower. Encouraged coughing and deep breathing and incentive  spirometer use.   FOLLOW UP:  Followup one week from discharge with Dr. Noel Gerold. Instructed to  call for an appointment.   MEDICATIONS AT DISCHARGE:  Percocet and Robaxin. Also recommended a  multivitamin daily, calcium citrate, and over-the-counter laxative if  needed.   DIET:  Regular home diet as tolerated.    CONDITION ON DISCHARGE:  Stable and improved.   DISPOSITION:  The patient is being discharged to her home with her family's  assistance.      Verlin Fester, P.A.                       Sharolyn Douglas, M.D.    CM/MEDQ  D:  04/24/2003  T:  04/25/2003  Job:  [id number]

## 2010-11-26 NOTE — Op Note (Signed)
NAME:  [name], [name]                           ACCOUNT NO.:  [id number]   MEDICAL RECORD NO.:  [id number]                   PATIENT TYPE:  INP   LOCATION:  5017                                 FACILITY:  [facility]   PHYSICIAN:  Sharolyn Douglas, M.D.                     DATE OF BIRTH:  10-Oct-1930   DATE OF PROCEDURE:  02/19/2003  DATE OF DISCHARGE:                                 OPERATIVE REPORT   PREOPERATIVE DIAGNOSIS:  Degenerative lumbar spondylolisthesis, L4-5 with  spinal stenosis and left lower extremity radiculopathy.   POSTOPERATIVE DIAGNOSIS:  Degenerative lumbar spondylolisthesis, L4-5 with  spinal stenosis and left lower extremity radiculopathy.   OPERATION PERFORMED:  1. L4-5 lumbar laminectomy with decompression of the common dural sac and L4     and L5 nerve roots bilaterally.  2. Transforaminal lumbar interbody fusion, L4-5 with placement of a 13 mm     Spinal Concepts PEAK prosthesis spacer packed with local autogenous bone     graft.  3. Pedicle screw instrumentation, L4-5 using the Spinal Concepts and Compass     system.  4. Posterior spinal arthrodesis L4-5 with local autogenous bone graft     supplemented with Pro-Osteon bone graft substitute and platelet gel.  5. Monitoring of free-running EMGs for three hours and testing of four     pedicle screws with triggered EMGs.   SURGEON:  Sharolyn Douglas, M.D.   ASSISTANT:  Verlin Fester, P.A.   ANESTHESIA:  General endotracheal.   COMPLICATIONS:  None.   ESTIMATED BLOOD LOSS:  .   INDICATIONS FOR PROCEDURE:  The patient is a 77 year old female with  progressively worsening back and bilateral lower extremity pain with  symptoms consistent with neurogenic claudication.  She also has radicular  type pain in the left leg in an L5 distribution.  Her plain radiographs show  a degenerative spondylolisthesis at L4-5 with motion on flexion and  extension.  CT myelogram showed severe spinal stenosis at L4-5 with lateral  recess and foraminal narrowing.  Because of her persistent symptoms  refractory to all conservative care, she has elected to undergo  decompression and fusion in hopes of improving her symptoms.   DESCRIPTION OF PROCEDURE:  The patient was properly identified in the  holding area taken to the operating room.  She underwent general  endotracheal anesthesia without difficulty.  She was given prophylactic IV  antibiotics.  EMG leads were placed for monitoring of triggered and free-  running EMGs.  She was carefully turned prone onto the Acromed four poster  positioning frame.  All bony prominences were padded.  Face and eyes were  protected at all times.  The back was prepped and draped in the usual  sterile fashion.  A 10 cm incision was made over the L4-5 interspace.  Dissection was carried down through the deep fascia.  The paraspinal muscles  were subperiosteally  elevated out to the tips of the L4 and 5 transverse  processes.  A deep retractor was placed.  The L4-5 facet joints were noted  to be very hypertrophic and unstable.  There were large cysts coming off of  the joint, left greater than right.  A laminectomy was performed at L4-5  removing the spinous process of L4 and the entire lamina.  The ligamentum  flavum was undercut from the L3 lamina.  We encountered thickened ligamentum  flavum with hypertrophy of the facet joints contributing to the severe  central as well as lateral recess narrowing, again left greater than right.  When we were done, the L4 and L5 nerve roots were completely decompressed  out their respective foramina.  We then turned our attention to performing a  facetectomy on the left side at L4-5.  The inferior facet of L4 was  osteotomized through the pars.  The superior facet was osteotomized flush  with the L5 pedicle.  The exiting L5 nerved root was identified.  This  established the transforaminal window.  Bleeding was controlled with bipolar  electrocautery.   Sharp annulotomy was performed.  We observed free funning  EMGs throughout the TLIF procedure.  There were no deleterious changes.  We  then performed a radical diskectomy across to the contralateral sides using  specialized TLIF instrumentation.  The disk space was dilated to 13 mm.  We  then tightly packed the disk space with local autogenous bone graft from the  decompression which had been mixed with Pro-Osteon bone graft substitute and  platelet gel.  A 13 mm Spinal Concepts PEAK prosthesis spacer was then  packed with the bone graft mixture and carefully tamped into the disk space  and across the midline.  AP and lateral fluoroscopy showed good position of  the graft.  We then turned our attention to placing pedicle screws at L4 and  5 using anatomic probing techniques.  Each pedicle starting point was  identified anatomically.  We were able to palpate the pedicles from within  the spinal canal.  An awl was used to initiate the hole.  The pedicle probe  was used to cannulate the pedicle.  Each pedicle was then palpated using  blunt probe confirming there were no breaches.  We were also able to check  the pedicle from within the spinal canal.  6.5 x 45 mm screws were placed at  L4 and 5 bilaterally.  Each screw was then tested using triggered EMGs.  In  each case we had response at greater than 16mA consistent with interosseous  placement of the screws.  We then turned out attention to performing the  posterolateral arthrodesis.  The transverse processes of L4 and 5 were  decorticated using a high speed bur.  The remaining bone graft mixture was  tightly packed into the lateral gutters.  We placed our titanium rods with  gentle compression across the L4-5 interspace.  The locking caps were  sheared off.  A cross connector was placed.  The nerves were once again checked and again found to be free out their respective foramen.  We placed  Gelfoam over the exposed dura.  The deep Hemovac  drain was placed.  We  closed the deep fascia with a running #1 Vicryl, subcutaneous layer was  closed with a 2-0 Vicryl followed by a running 3-0 nylon suture on the skin.  Sterile dressing was applied.  The patient was turned supine, extubated  without difficulty, transferred to  the recovery room in stable condition,  able to move her upper and lower extremities.                                                Sharolyn Douglas, M.D.    MC/MEDQ  D:  02/19/2003  T:  02/20/2003  Job:  [id number]

## 2010-11-26 NOTE — Consult Note (Signed)
[place]  Patient:    [name], KROCK Visit Number: [id number] MRN: [id number]          Service Type: MED Location: [phone] Attending Physician:  Lillia Mountain Dictated by:   Francisca December, M.D. Proc. Date: 01/23/02 Admit Date:  01/22/2002 Discharge Date: 01/23/2002   CC:         Duncan Dull, M.D.  Pearla Dubonnet, M.D.   Consultation Report  REASON FOR CONSULTATION:  Chest pain.  IMPRESSIONS: 1. Atypical angina (described as anginal pain, but primarily at rest). 2. Multiple cardiac risk factors including hypertension, hypercholesterolemia,    and tobacco abuse. 3. Myocardial infarction has been ruled out.  RECOMMENDATION:  Will proceed with exercise myocardial perfusion centigraphy. Further recommendations based on this study.  If normal then would treat for GERD.  If abnormal then would require cardiac catheterization for further diagnosis and possible therapy.  FINDINGS:  [name] is a pleasant 77 year old woman with a history of hypertension, hypercholesterolemia, and tobacco usage who presented to [facility] Emergency Room with a one day history of intermittent anterior substernal sharp to aching chest pain that radiated through to the back.  She had one episode just prior to coming to the hospital where she had radiation to the right jaw.  The episodes will last about 10 minutes each and then resolve spontaneously.  They were becoming more frequent and somewhat more prolonged so she presented to the emergency room.  There was some nausea with each episode per the admission physicians note; however, she did not cooberate that with me.  She has been noticing increasing shortness of breath with minimal activity for the past six months, but is not terribly physically active at all.  Her symptoms were improved with nitroglycerin in the emergency room.  ECG was normal on presentation.  PAST MEDICAL HISTORY: 1.  Hypertension. 2. Hypercholesterolemia.  PAST SURGICAL HISTORY: 1. Cranial arterial aneurysm repair x2. 2. Partial colon resection for diverticulitis. 3. Hysterectomy and appendectomy.  SOCIAL HISTORY:  She has a 55-pack-year smoking history.  She does not use alcohol.  She is retired from [employer].  She is widowed for the past 10 years. Lives at home in [place] with her daughters family.  CURRENT MEDICATIONS: 1. HCTZ 25 mg p.o. q.d. 2. Lisinopril 10 mg p.o. q.d. 3. Lovastatin 80 mg p.o. q.d. 4. Aspirin 325 mg p.o. q.d.  ALLERGIES:  Apparently include PENICILLIN, ROCEPHIN, CIPRO, LIPITOR which produces hives, also SULFA.  FAMILY HISTORY:  Not significant for early coronary disease.  PHYSICAL EXAMINATION  VITAL SIGNS:  Blood pressure 130/80, pulse 83 and regular, respirations 16, temperature 97.4, O2 saturation 96% on room air.  GENERAL:  This is a well-appearing 77 year old woman.  Appears her stated age. No acute distress.  HEENT:  Unremarkable.  Pupils are equal, round, and reactive to light and accommodation.  Extraocular movements are intact.  Oral mucosa is pink and moist.  She has upper and lower dentures.  Tongue is not coated.  NECK:  Supple without thyromegaly or masses.  Carotid upstrokes are normal. There is no bruit.  There is no jugular venous distention.  CHEST:  Diminished breath sounds and reduced excursion bilaterally.  One single end-expiratory wheeze was heard.  No rales or rhonchi.  HEART:  Regular rhythm.  Normal S1 and S2.  No S3, S4, murmur, click, or rub noted.  ABDOMEN:  Soft, flat, nontender.  No hepatosplenomegaly or midline pulsatile mass.  PELVIC:  External genitalia is atrophic.  RECTAL:  Not performed.  EXTREMITIES:  Full range of motion.  No edema.  Intact distal pulses.  NEUROLOGIC:  Cranial nerves II-XII are intact.  Motor and sensory grossly intact.  Gait is adequate for treadmill testing.  SKIN:  Warm, dry, and  clear.  LABORATORIES:  Initial CK 47, MB 1.7, troponin 0.01.  This was unchanged for the second set of CK-MB.  Serum electrolytes, BUN, creatinine, glucose are all within normal limits.  Negative PT, PTT.  Liver enzymes are normal as is the admission hemogram.  Electrocardiogram is normal sinus rhythm, normal EKG x2.  IMPRESSIONS:  In the absence of any objective evidence of ischemia and despite her relatively good story for angina pectoris, I think that beginning with an exercise perfusion study is appropriate for [name].  Will attempt to arrange for that later today.  If nondiagnostic or positive would require cardiac catheterization.  If negative then I would treat for gastroesophageal reflux disease as mentioned above. Dictated by:   Francisca December, M.D. Attending Physician:  Lillia Mountain DD:  01/23/02 TD:  01/25/02 Job: [id number]

## 2010-11-26 NOTE — H&P (Signed)
NAME:  [name], [name]                           ACCOUNT NO.:  [id number]   MEDICAL RECORD NO.:  [id number]                   PATIENT TYPE:  INP   LOCATION:                                       FACILITY:  [facility]   PHYSICIAN:  Verlin Fester, P.A.                 DATE OF BIRTH:  10-25-1930   DATE OF ADMISSION:  02/19/2003  DATE OF DISCHARGE:                                HISTORY & PHYSICAL   CHIEF COMPLAINT:  Severe back pain.   HISTORY OF PRESENT ILLNESS:  The patient is a 77 year old female with a long  history of back pain that has been getting progressively worse.  She is  having chronic severe pain in her back affecting her activities of daily  living and quality of life.  She has tried numerous methods of conservative  management including pain medications, antiinflammatories, rest, activity  modification, and physical therapy as well as injections without any  significant relief of her symptoms.  Unfortunately she continues to have  back pain that is affecting her life, therefore risks and benefits of  proposed surgery were discussed with the patient, she indicated her  understanding and wished to proceed.   ALLERGIES:  ROCEPHIN caused her to pass out.  CIPRO, LIPITOR, AND SULFA  caused a rash.  VALIUM - has an unknown reaction but she does report an  allergy.   MEDICATIONS:  1. Hydrochlorothiazide 25 mg p.o. daily.  2. Avapro 300 mg p.o. daily.  3. Aspirin (which she has stopped in preparation for surgery).  4. Prilosec 20 mg p.o. daily.  5. Percocet p.r.n. pain.  6. Mevacor 40 mg two p.o. q.h.s.  7. Albuterol inhaler p.r.n.  8. Atrovent inhaler p.r.n.   PAST MEDICAL HISTORY:  1. Significant for hypertension.  2. Cerebral aneurysm repair.  3. Hypercholesterolemia.  4. Chronic bronchitis.  5. A small intracerebral hemorrhage in January, 2004.  6. Diverticulitis.   PAST SURGICAL HISTORY:  1. Sigmoid colectomy secondary to diverticulitis.  2. Cerebral aneurysm  repair.  3. Hysterectomy.  4. Appendectomy.   SOCIAL HISTORY:  The patient denies tobacco use, denies alcohol use.  She is  widowed, retired.  Her daughter will be available to help her  postoperatively, she does live with the patient.   FAMILY MEDICAL HISTORY:  Significant for cancer, hypertension, diabetes and  stroke.   REVIEW OF SYSTEMS:  The patient denies any fevers, chills, sweats or  bleeding tendencies.  CNS: Denies blurred vision, double vision, seizures,  headaches, glasses.  CARDIOVASCULAR: Denies chest pain, angina, no  claudication or palpitations.  PULMONARY: Denies shortness of breath,  productive cough or hemoptysis.  GI: Denies nausea, vomiting, constipation,  diarrhea, melena, blood in stool.  GU: Denies history of hematuria or  discharge.  MUSCULOSKELETAL: As history of present illness.   PHYSICAL EXAMINATION:  VITAL SIGNS: Blood pressure 142/88, respirations 16  and unlabored,  pulse is 78 and regular.  GENERAL: The patient is a 77 year old female who is alert and oriented, in  no acute distress.  She is well-nourished, well groomed. She is cooperative  to exam.  She is here with her daughter today.  HEENT: Head is normocephalic, atraumatic.  Pupils are equal, round and  reactive.  Extraocular movements are intact.  Nares patent.  Pharynx is  clear.  NECK: Soft and supple to palpation, no bruits appreciated, no  lymphadenopathy or thyromegaly noted.  CHEST: Clear to auscultation bilaterally, no rales, rhonchi, wheezes,  friction rubs.  HEART: S1, S2 regular rate and rhythm without murmurs, rubs, or gallops  noted.  ABDOMEN: Soft to palpation, nontender, nondistended, no organomegaly noted.  GU: Is not performed.  EXTREMITIES: Motor function and sensation are grossly intact throughout.  Pulses are intact and symmetric bilaterally.  SKIN: Intact without any lesions or rashes.   X-ray and MRI showed degenerative disk disease as well as spinal stenosis  and  spondylolisthesis at L4-5.   IMPRESSION:  1. Grade I spondylolisthesis at L4-5 with spinal stenosis.  2. Hypertension.  3. History of cerebral aneurysm repair.  4. Hypercholesterolemia.  5. Chronic bronchitis.  6. Recent small intracerebral hemorrhage in January which is stable.   PLAN:  1. Admit to [facility] on February 19, 2003 for L4-5 laminectomy and     posterior spinal fusion with pedical screws and T-lift procedure.  This     will all be done by Dr. Sharolyn Douglas.  2.     The patient's primary care physician Dr. Shaune Pollack has sent [place] a letter of     medical clearance indicating that the patient may proceed as planned, she     is medically stable currently.  We will consult [facility] hospitalist while     she is hospitalized for assistance with medical care and management.                                                Verlin Fester, P.A.    CM/MEDQ  D:  02/19/2003  T:  02/20/2003  Job:  [id number]   cc:   Duncan Dull, M.D.  [address]  [place]  [place] [address]  Fax: [phone]

## 2011-04-06 LAB — URINALYSIS, ROUTINE W REFLEX MICROSCOPIC
Hgb urine dipstick: NEGATIVE
Nitrite: NEGATIVE
Specific Gravity, Urine: 1.014
Urobilinogen, UA: 0.2

## 2011-04-06 LAB — CBC
HCT: 39
Hemoglobin: 13.3
MCHC: 34.8
Platelets: 217
RBC: 4.37
RDW: 14.3
RDW: 14.6
WBC: 8.3

## 2011-04-06 LAB — TYPE AND SCREEN
ABO/RH(D): A POS
Antibody Screen: NEGATIVE

## 2011-04-06 LAB — COMPREHENSIVE METABOLIC PANEL
AST: 14
Albumin: 3.9
Alkaline Phosphatase: 91
BUN: 17
GFR calc Af Amer: >60
Potassium: 4.3
Sodium: 139
Total Protein: 7

## 2011-04-06 LAB — BASIC METABOLIC PANEL
CO2: 28
Calcium: 8.8
Creatinine, Ser: 0.89
GFR calc Af Amer: >60
Glucose, Bld: 127 — ABNORMAL HIGH

## 2011-04-06 LAB — PROTIME-INR: INR: 0.9

## 2011-04-21 LAB — URINALYSIS, ROUTINE W REFLEX MICROSCOPIC
Protein, ur: NEGATIVE
Urobilinogen, UA: 0.2

## 2011-04-21 LAB — POCT CARDIAC MARKERS
CKMB, poc: 2.2
Myoglobin, poc: 57.7
Operator id: 106841

## 2011-04-21 LAB — BASIC METABOLIC PANEL
CO2: 27
Calcium: 8.7
Creatinine, Ser: 0.84
GFR calc Af Amer: >60

## 2011-04-21 LAB — CBC
MCHC: 34.4
Platelets: 251
RBC: 4.14
WBC: 7.8

## 2011-04-21 LAB — DIFFERENTIAL
Basophils Relative: 0
Monocytes Relative: 5
Neutro Abs: 5.9
Neutrophils Relative %: 76

## 2012-04-04 ENCOUNTER — Telehealth: Payer: Self-pay

## 2012-06-15 NOTE — Telephone Encounter (Signed)
None

## 2012-09-03 ENCOUNTER — Telehealth: Payer: Self-pay | Admitting: Radiology

## 2012-09-03 ENCOUNTER — Telehealth: Payer: Self-pay

## 2012-09-03 NOTE — Telephone Encounter (Signed)
Her last OV here was 1999. She was not given one in the past 10 yrs. She is advised she is probably due for this. She can have this when she is here. She also is asking about her pnuemonia shot.

## 2012-09-03 NOTE — Telephone Encounter (Signed)
I am questioning if this is the correct chart. Patient knows all our office staff and has appt scheduled. This patient has no appt and has not been seen since 1999. Office staff is helping with this. [name]

## 2012-09-03 NOTE — Telephone Encounter (Signed)
Pt would like to see if we have given her a tetnus shot in the past.  Please [phone]

## 2012-09-03 NOTE — Telephone Encounter (Signed)
Error

## 2012-09-03 NOTE — Telephone Encounter (Signed)
Incorrect chart/ message taken and responded to in correct chart.

## 2012-10-16 ENCOUNTER — Telehealth: Payer: Self-pay

## 2012-10-16 NOTE — Telephone Encounter (Signed)
Wrong patient, should be Sallyanne Havers,

## 2012-10-16 NOTE — Telephone Encounter (Signed)
Please call - patient has questions regarding her steriod injections on Friday   [phone]

## 2012-10-16 NOTE — Telephone Encounter (Signed)
Patient having ESI by Dr Ethelene Hal

## 2012-10-16 NOTE — Telephone Encounter (Signed)
Called, no answer.

## 2012-11-15 ENCOUNTER — Telehealth: Payer: Self-pay

## 2012-11-15 NOTE — Telephone Encounter (Signed)
Opened in error

## 2015-05-30 ENCOUNTER — Telehealth: Payer: Self-pay | Admitting: Gastroenterology

## 2015-05-30 NOTE — Telephone Encounter (Signed)
Patient called c/o fecal incontinence. Advised patient to increase fiber intake to bulk stool, ok to use PRN imodium and follow up in office visit

## 2016-06-07 ENCOUNTER — Telehealth: Payer: Self-pay

## 2016-06-07 NOTE — Telephone Encounter (Signed)
Patient wants to know why her pantoprazole was 20 mg when she had been taking 40 mg.   Dr. Katrinka BlazingSmith  [phone]
